# Patient Record
Sex: Female | Born: 1980 | Race: White | Hispanic: No | Marital: Married | Smoking: Never smoker
Health system: Southern US, Community
[De-identification: ages and names within clinical notes are randomized; demographics above are authoritative.]

## PROBLEM LIST (undated history)

## (undated) DIAGNOSIS — Z789 Other specified health status: Secondary | ICD-10-CM

## (undated) DIAGNOSIS — I8390 Asymptomatic varicose veins of unspecified lower extremity: Secondary | ICD-10-CM

## (undated) HISTORY — DX: Asymptomatic varicose veins of unspecified lower extremity: I83.90

## (undated) HISTORY — PX: VENOUS THROMBECTOMY: SHX834

---
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
## 2006-08-11 ENCOUNTER — Ambulatory Visit: Payer: Self-pay | Admitting: Family Medicine

## 2007-10-22 IMAGING — US ABDOMEN ULTRASOUND
1 series · 17 of 25 positions shown · non-contrast
Comparison: none

REASON FOR EXAM: abdominal pain
COMMENTS:

[Series 1: abdomen ultrasound · 17 of 78 slices shown]
[im 1/78]
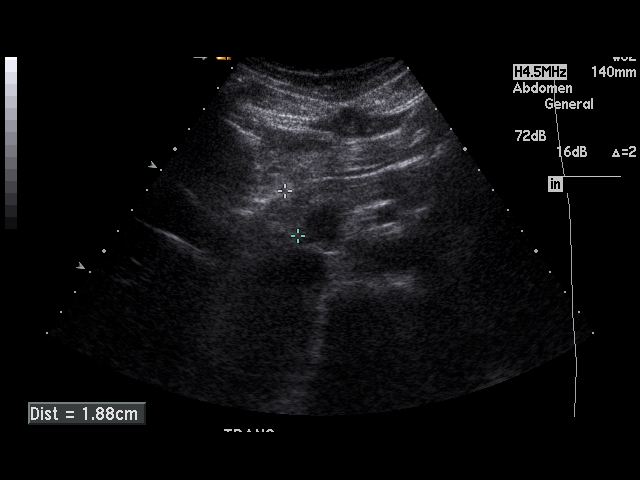
[im 7/78]
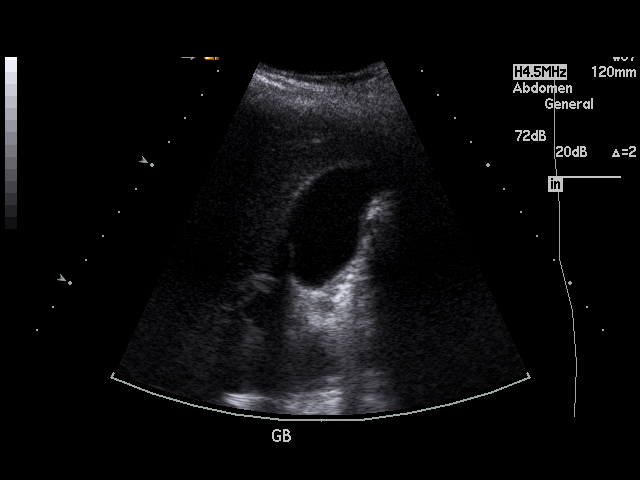
[im 10/78]
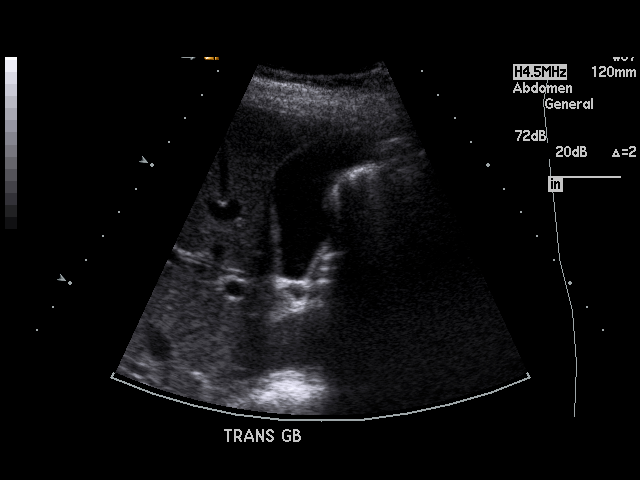
[im 17/78]
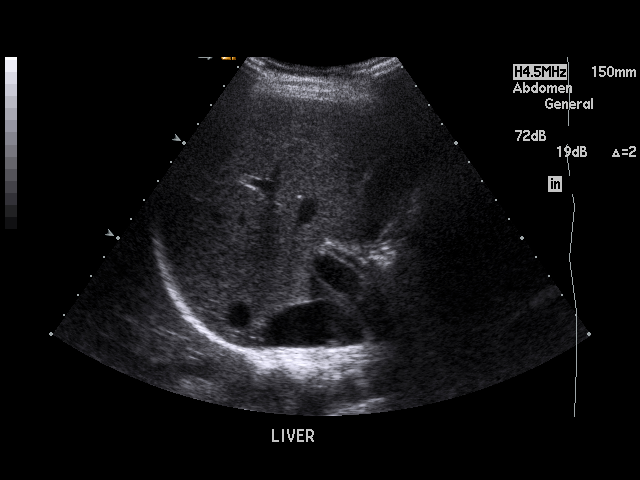
[im 20/78]
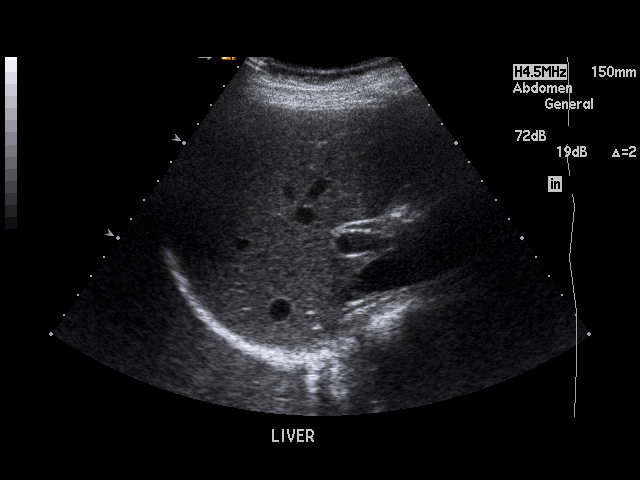
[im 26/78]
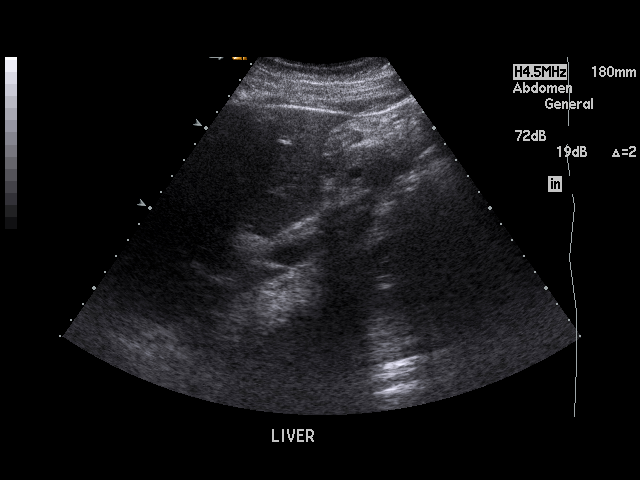
[im 29/78]
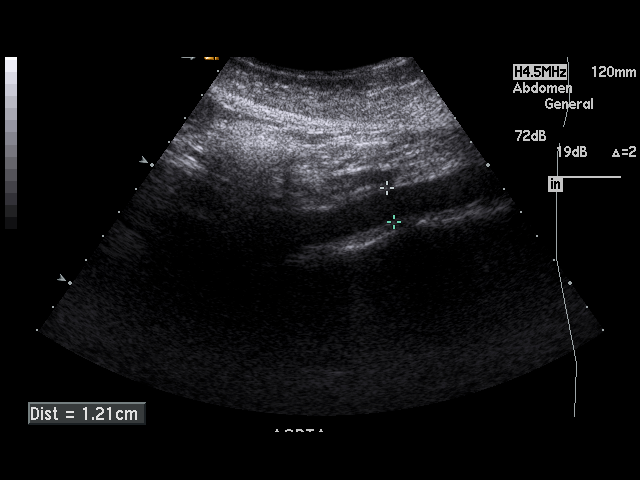
[im 36/78]
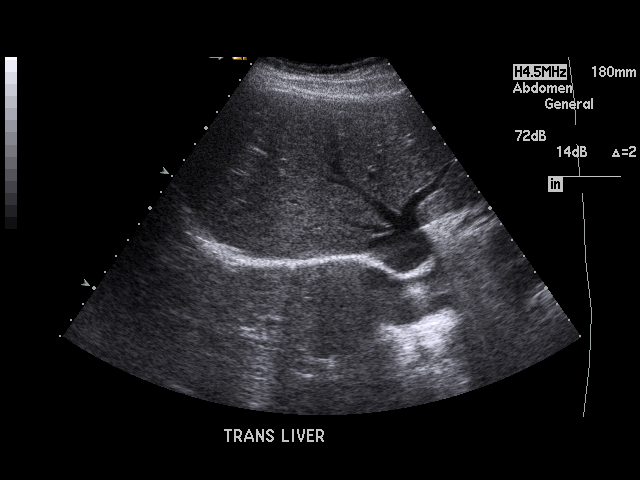
[im 39/78]
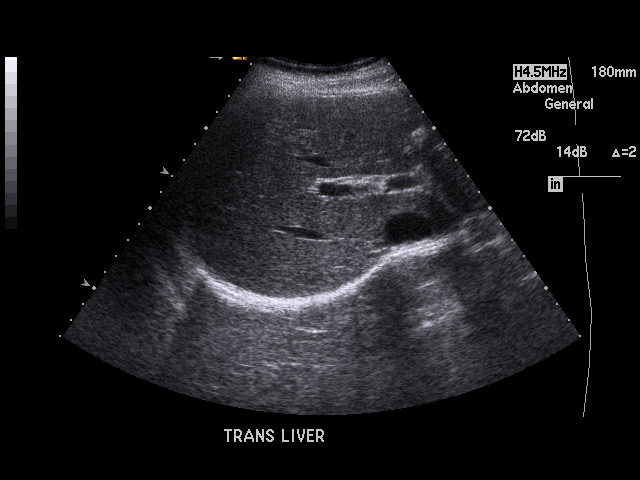
[im 42/78]
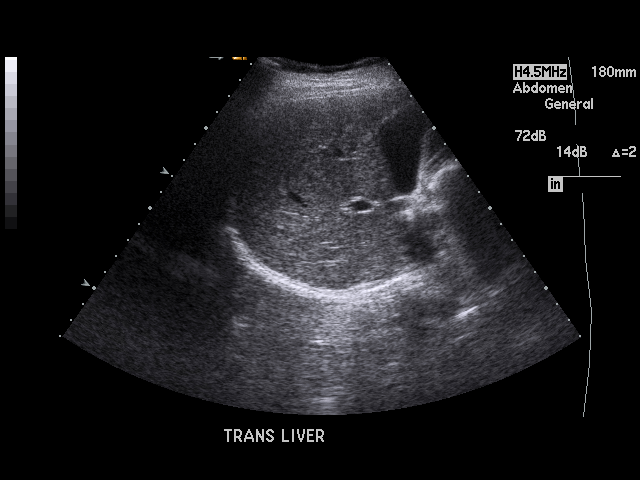
[im 49/78]
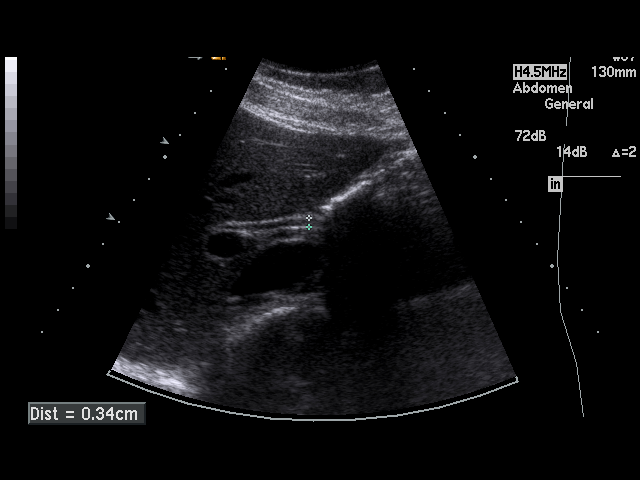
[im 52/78]
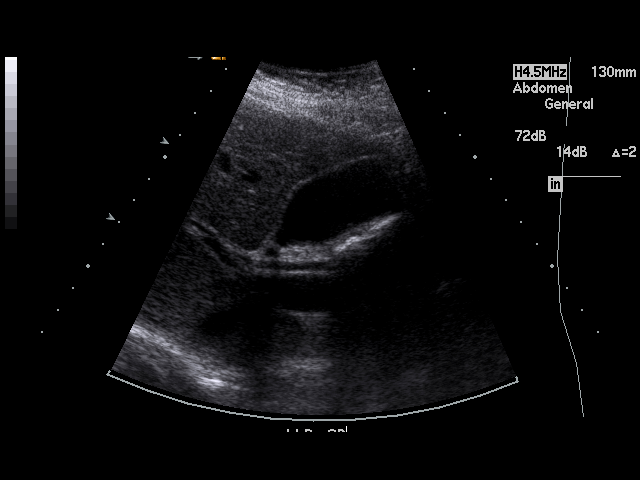
[im 58/78]
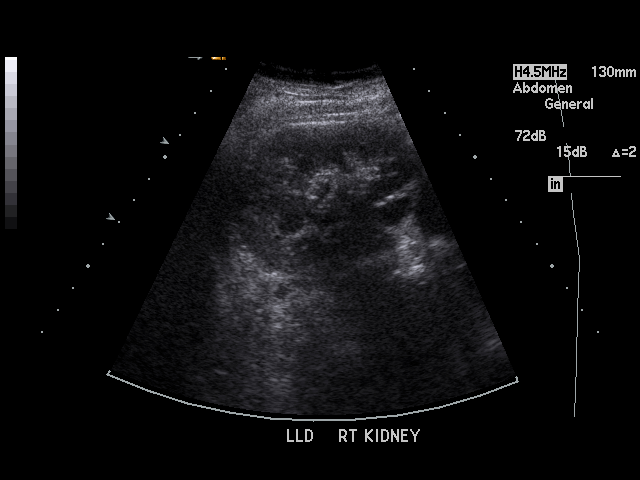
[im 61/78]
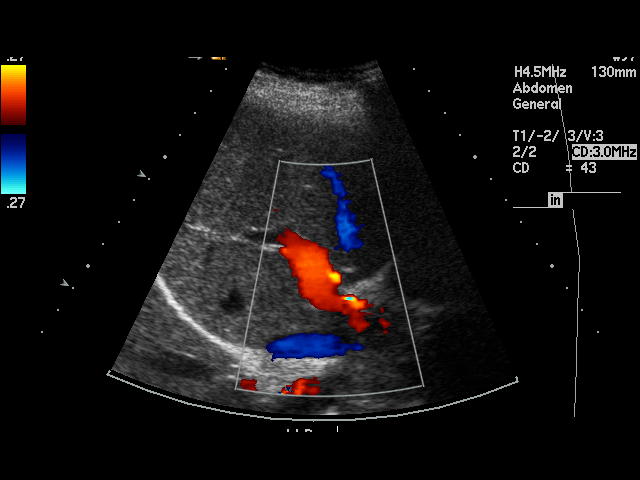
[im 68/78]
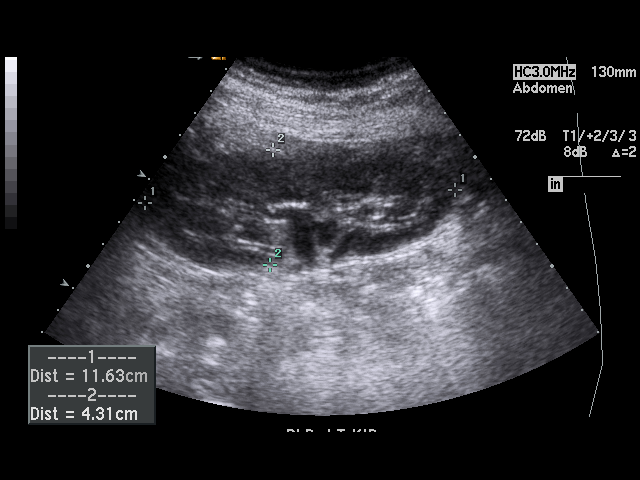
[im 71/78]
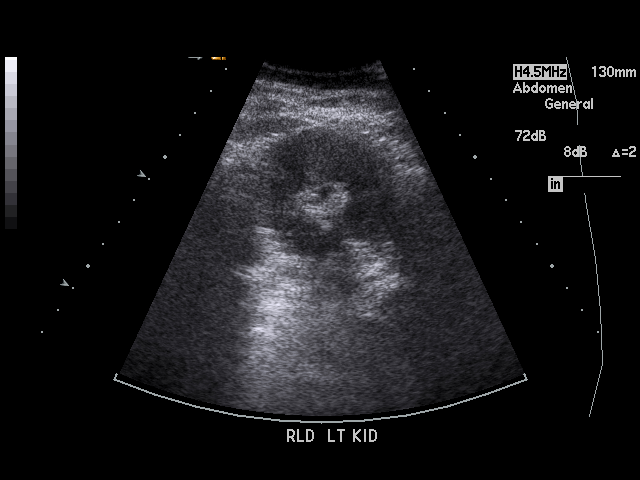
[im 78/78]
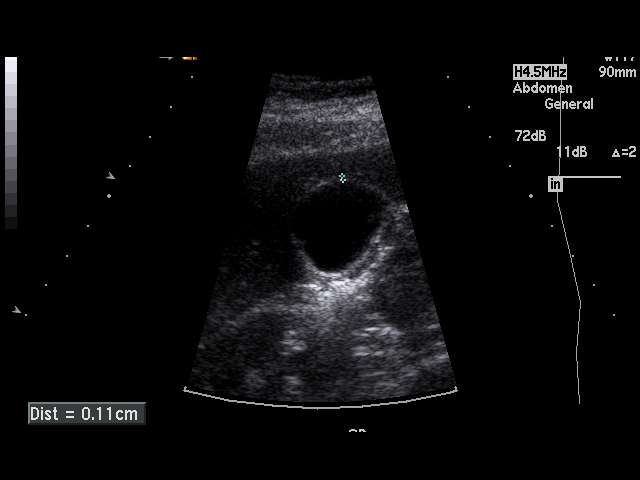

[17 of 25 positions shown; findings below may reference images not displayed]

PROCEDURE:     ULKER - ULKER ABDOMEN GENERAL SURVEY  - August 11, 2006 [DATE]

RESULT:     The liver, spleen, pancreas and abdominal aorta reveal no
significant abnormalities. No gallstones are seen. There is no thickening of
the gallbladder wall. The common bile duct measures 3.7 mm in diameter which
is within normal limits. The kidneys show no hydronephrosis. There is no
ascites.
IMPRESSION: 1.     No significant abnormalities are noted.

## 2015-06-27 ENCOUNTER — Encounter: Payer: Self-pay | Admitting: Obstetrics and Gynecology

## 2015-06-27 ENCOUNTER — Ambulatory Visit (INDEPENDENT_AMBULATORY_CARE_PROVIDER_SITE_OTHER): Payer: 59 | Admitting: Obstetrics and Gynecology

## 2015-06-27 VITALS — BP 102/61 | HR 68 | Ht 68.5 in | Wt 123.1 lb

## 2015-06-27 DIAGNOSIS — Z01419 Encounter for gynecological examination (general) (routine) without abnormal findings: Secondary | ICD-10-CM | POA: Diagnosis not present

## 2015-06-27 DIAGNOSIS — Z809 Family history of malignant neoplasm, unspecified: Secondary | ICD-10-CM

## 2015-06-27 NOTE — Patient Instructions (Signed)
Health Maintenance, Female Adopting a healthy lifestyle and getting preventive care can go a long way to promote health and wellness. Talk with your health care provider about what schedule of regular examinations is right for you. This is a good chance for you to check in with your provider about disease prevention and staying healthy. In between checkups, there are plenty of things you can do on your own. Experts have done a lot of research about which lifestyle changes and preventive measures are most likely to keep you healthy. Ask your health care provider for more information. WEIGHT AND DIET  Eat a healthy diet  Be sure to include plenty of vegetables, fruits, low-fat dairy products, and lean protein.  Do not eat a lot of foods high in solid fats, added sugars, or salt.  Get regular exercise. This is one of the most important things you can do for your health.  Most adults should exercise for at least 150 minutes each week. The exercise should increase your heart rate and make you sweat (moderate-intensity exercise).  Most adults should also do strengthening exercises at least twice a week. This is in addition to the moderate-intensity exercise.  Maintain a healthy weight  Body mass index (BMI) is a measurement that can be used to identify possible weight problems. It estimates body fat based on height and weight. Your health care provider can help determine your BMI and help you achieve or maintain a healthy weight.  For females 20 years of age and older:   A BMI below 18.5 is considered underweight.  A BMI of 18.5 to 24.9 is normal.  A BMI of 25 to 29.9 is considered overweight.  A BMI of 30 and above is considered obese.  Watch levels of cholesterol and blood lipids  You should start having your blood tested for lipids and cholesterol at 33 years of age, then have this test every 5 years.  You may need to have your cholesterol levels checked more often if:  Your lipid  or cholesterol levels are high.  You are older than 33 years of age.  You are at high risk for heart disease.  CANCER SCREENING   Lung Cancer  Lung cancer screening is recommended for adults 55-80 years old who are at high risk for lung cancer because of a history of smoking.  A yearly low-dose CT scan of the lungs is recommended for people who:  Currently smoke.  Have quit within the past 15 years.  Have at least a 30-pack-year history of smoking. A pack year is smoking an average of one pack of cigarettes a day for 1 year.  Yearly screening should continue until it has been 15 years since you quit.  Yearly screening should stop if you develop a health problem that would prevent you from having lung cancer treatment.  Breast Cancer  Practice breast self-awareness. This means understanding how your breasts normally appear and feel.  It also means doing regular breast self-exams. Let your health care provider know about any changes, no matter how small.  If you are in your 20s or 30s, you should have a clinical breast exam (CBE) by a health care provider every 1-3 years as part of a regular health exam.  If you are 40 or older, have a CBE every year. Also consider having a breast X-ray (mammogram) every year.  If you have a family history of breast cancer, talk to your health care provider about genetic screening.  If you   are at high risk for breast cancer, talk to your health care provider about having an MRI and a mammogram every year.  Breast cancer gene (BRCA) assessment is recommended for women who have family members with BRCA-related cancers. BRCA-related cancers include:  Breast.  Ovarian.  Tubal.  Peritoneal cancers.  Results of the assessment will determine the need for genetic counseling and BRCA1 and BRCA2 testing. Cervical Cancer Your health care provider may recommend that you be screened regularly for cancer of the pelvic organs (ovaries, uterus, and  vagina). This screening involves a pelvic examination, including checking for microscopic changes to the surface of your cervix (Pap test). You may be encouraged to have this screening done every 3 years, beginning at age 21.  For women ages 30-65, health care providers may recommend pelvic exams and Pap testing every 3 years, or they may recommend the Pap and pelvic exam, combined with testing for human papilloma virus (HPV), every 5 years. Some types of HPV increase your risk of cervical cancer. Testing for HPV may also be done on women of any age with unclear Pap test results.  Other health care providers may not recommend any screening for nonpregnant women who are considered low risk for pelvic cancer and who do not have symptoms. Ask your health care provider if a screening pelvic exam is right for you.  If you have had past treatment for cervical cancer or a condition that could lead to cancer, you need Pap tests and screening for cancer for at least 20 years after your treatment. If Pap tests have been discontinued, your risk factors (such as having a new sexual partner) need to be reassessed to determine if screening should resume. Some women have medical problems that increase the chance of getting cervical cancer. In these cases, your health care provider may recommend more frequent screening and Pap tests. Colorectal Cancer  This type of cancer can be detected and often prevented.  Routine colorectal cancer screening usually begins at 33 years of age and continues through 33 years of age.  Your health care provider may recommend screening at an earlier age if you have risk factors for colon cancer.  Your health care provider may also recommend using home test kits to check for hidden blood in the stool.  A small camera at the end of a tube can be used to examine your colon directly (sigmoidoscopy or colonoscopy). This is done to check for the earliest forms of colorectal  cancer.  Routine screening usually begins at age 50.  Direct examination of the colon should be repeated every 5-10 years through 33 years of age. However, you may need to be screened more often if early forms of precancerous polyps or small growths are found. Skin Cancer  Check your skin from head to toe regularly.  Tell your health care provider about any new moles or changes in moles, especially if there is a change in a mole's shape or color.  Also tell your health care provider if you have a mole that is larger than the size of a pencil eraser.  Always use sunscreen. Apply sunscreen liberally and repeatedly throughout the day.  Protect yourself by wearing long sleeves, pants, a wide-brimmed hat, and sunglasses whenever you are outside. HEART DISEASE, DIABETES, AND HIGH BLOOD PRESSURE   High blood pressure causes heart disease and increases the risk of stroke. High blood pressure is more likely to develop in:  People who have blood pressure in the high end   of the normal range (130-139/85-89 mm Hg).  People who are overweight or obese.  People who are African American.  If you are 38-23 years of age, have your blood pressure checked every 3-5 years. If you are 61 years of age or older, have your blood pressure checked every year. You should have your blood pressure measured twice--once when you are at a hospital or clinic, and once when you are not at a hospital or clinic. Record the average of the two measurements. To check your blood pressure when you are not at a hospital or clinic, you can use:  An automated blood pressure machine at a pharmacy.  A home blood pressure monitor.  If you are between 45 years and 39 years old, ask your health care provider if you should take aspirin to prevent strokes.  Have regular diabetes screenings. This involves taking a blood sample to check your fasting blood sugar level.  If you are at a normal weight and have a low risk for diabetes,  have this test once every three years after 33 years of age.  If you are overweight and have a high risk for diabetes, consider being tested at a younger age or more often. PREVENTING INFECTION  Hepatitis B  If you have a higher risk for hepatitis B, you should be screened for this virus. You are considered at high risk for hepatitis B if:  You were born in a country where hepatitis B is common. Ask your health care provider which countries are considered high risk.  Your parents were born in a high-risk country, and you have not been immunized against hepatitis B (hepatitis B vaccine).  You have HIV or AIDS.  You use needles to inject street drugs.  You live with someone who has hepatitis B.  You have had sex with someone who has hepatitis B.  You get hemodialysis treatment.  You take certain medicines for conditions, including cancer, organ transplantation, and autoimmune conditions. Hepatitis C  Blood testing is recommended for:  Everyone born from [birth year] through 1963.  Anyone with known risk factors for hepatitis C. Sexually transmitted infections (STIs)  You should be screened for sexually transmitted infections (STIs) including gonorrhea and chlamydia if:  You are sexually active and are younger than 33 years of age.  You are older than 33 years of age and your health care provider tells you that you are at risk for this type of infection.  Your sexual activity has changed since you were last screened and you are at an increased risk for chlamydia or gonorrhea. Ask your health care provider if you are at risk.  If you do not have HIV, but are at risk, it may be recommended that you take a prescription medicine daily to prevent HIV infection. This is called pre-exposure prophylaxis (PrEP). You are considered at risk if:  You are sexually active and do not regularly use condoms or know the HIV status of your partner(s).  You take drugs by injection.  You are sexually  active with a partner who has HIV. Talk with your health care provider about whether you are at high risk of being infected with HIV. If you choose to begin PrEP, you should first be tested for HIV. You should then be tested every 3 months for as long as you are taking PrEP.  PREGNANCY   If you are premenopausal and you may become pregnant, ask your health care provider about preconception counseling.  If you may  become pregnant, take 400 to 800 micrograms (mcg) of folic acid every day.  If you want to prevent pregnancy, talk to your health care provider about birth control (contraception). OSTEOPOROSIS AND MENOPAUSE   Osteoporosis is a disease in which the bones lose minerals and strength with aging. This can result in serious bone fractures. Your risk for osteoporosis can be identified using a bone density scan.  If you are 61 years of age or older, or if you are at risk for osteoporosis and fractures, ask your health care provider if you should be screened.  Ask your health care provider whether you should take a calcium or vitamin D supplement to lower your risk for osteoporosis.  Menopause may have certain physical symptoms and risks.  Hormone replacement therapy may reduce some of these symptoms and risks. Talk to your health care provider about whether hormone replacement therapy is right for you.  HOME CARE INSTRUCTIONS   Schedule regular health, dental, and eye exams.  Stay current with your immunizations.   Do not use any tobacco products including cigarettes, chewing tobacco, or electronic cigarettes.  If you are pregnant, do not drink alcohol.  If you are breastfeeding, limit how much and how often you drink alcohol.  Limit alcohol intake to no more than 1 drink per day for nonpregnant women. One drink equals 12 ounces of beer, 5 ounces of wine, or 1 ounces of hard liquor.  Do not use street drugs.  Do not share needles.  Ask your health care provider for help if  you need support or information about quitting drugs.  Tell your health care provider if you often feel depressed.  Tell your health care provider if you have ever been abused or do not feel safe at home.   This information is not intended to replace advice given to you by your health care provider. Make sure you discuss any questions you have with your health care provider.   Document Released: 07/08/2010 Document Revised: 01/13/2014 Document Reviewed: 11/24/2012 Elsevier Interactive Patient Education [employer].

## 2015-06-27 NOTE — Progress Notes (Addendum)
GYNECOLOGY ANNUAL PHYSICAL EXAM PROGRESS NOTE  Subjective:    Debbie Harding is a 35 y.o. G0P0 female who presents to establish care, and for an annual exam. The patient has no complaints today. The patient is sexually active.  The patient wears seatbelts: yes. The patient participates in regular exercise: yes. Has the patient ever been transfused or tattooed?: no. The patient reports that there is not domestic violence in her life.    Gynecologic History Patient's last menstrual period was 06/07/2015. Menarche age: [age] Contraception: condoms History of STI's: Denies Last Pap: ~ 4 years ago. Results were: normal.  Denies h/o abnormal pap smears.    Obstetric History   G0   P0   T0   P0   A0   TAB0   SAB0   E0   M0   L0        History reviewed. No pertinent past medical history.  Past Surgical History  Procedure Laterality Date  . Venous thrombectomy Left     Family History  Problem Relation Age of Onset  . Breast cancer Maternal Grandmother   . Ovarian cancer Maternal Grandmother     Social History   Social History  . Marital Status: Single    Spouse Name: N/A  . Number of Children: N/A  . Years of Education: N/A   Occupational History  . Not on file.   Social History Main Topics  . Smoking status: Never Smoker   . Smokeless tobacco: Not on file  . Alcohol Use: Yes     Comment: Socially   . Drug Use: No  . Sexual Activity: Yes    Birth Control/ Protection: None   Other Topics Concern  . Not on file   Social History Narrative  . No narrative on file    Outpatient Encounter Prescriptions as of 06/27/2015  Medication Sig Note  . BOOSTRIX 5-2.5-18.5 injection Reported on 06/27/2015 06/27/2015: Received from: External Pharmacy   No facility-administered encounter medications on file as of 06/27/2015.    Allergies  Allergen Reactions  . Penicillins   . Sulfur       Review of Systems Constitutional: negative for chills, fatigue, fevers and  sweats Eyes: negative for irritation, redness and visual disturbance Ears, nose, mouth, throat, and face: negative for hearing loss, nasal congestion, snoring and tinnitus Respiratory: negative for asthma, cough, sputum Cardiovascular: negative for chest pain, dyspnea, exertional chest pressure/discomfort, irregular heart beat, palpitations and syncope Gastrointestinal: negative for abdominal pain, change in bowel habits, nausea and vomiting Genitourinary: negative for abnormal menstrual periods, genital lesions, sexual problems and vaginal discharge, dysuria and urinary incontinence Integument/breast: negative for breast lump, breast tenderness and nipple discharge Hematologic/lymphatic: negative for bleeding and easy bruising Musculoskeletal:negative for back pain and muscle weakness Neurological: negative for dizziness, headaches, vertigo and weakness Endocrine: negative for diabetic symptoms including polydipsia, polyuria and skin dryness Allergic/Immunologic: negative for hay fever and urticaria       Objective:  Blood pressure 102/61, pulse 68, height 5' 8.5" (1.74 m), weight 123 lb 1.6 oz (55.838 kg), last menstrual period 06/07/2015. Body mass index is 18.44 kg/(m^2).  General Appearance:    Alert, cooperative, no distress, appears stated age  Head:    Normocephalic, without obvious abnormality, atraumatic  Eyes:    PERRL, conjunctiva/corneas clear, EOM's intact, both eyes  Ears:    Normal external ear canals, both ears  Nose:   Nares normal, septum midline, mucosa normal, no drainage or sinus tenderness  Throat:   Lips, mucosa, and tongue normal; teeth and gums normal  Neck:   Supple, symmetrical, trachea midline, no adenopathy; thyroid: no enlargement/tenderness/nodules; no carotid bruit or JVD  Back:     Symmetric, no curvature, ROM normal, no CVA tenderness  Lungs:     Clear to auscultation bilaterally, respirations unlabored  Chest Wall:    No tenderness or deformity    Heart:    Regular rate and rhythm, S1 and S2 normal, no murmur, rub or gallop  Breast Exam:    No tenderness, masses, or nipple abnormality. Fibrocystic changes noted (right>left).  Abdomen:     Soft, non-tender, bowel sounds active all four quadrants, no masses, no organomegaly.    Genitalia:    Pelvic:external genitalia normal, vagina without lesions, discharge, or tenderness, rectovaginal septum  normal. Cervix normal in appearance, no cervical motion tenderness, no adnexal masses or tenderness.  Uterus normal size, shape, mobile, regular contours, nontender.  Rectal:    Normal external sphincter.  No hemorrhoids appreciated. Internal exam not done.   Extremities:   Extremities normal, atraumatic, no cyanosis or edema  Pulses:   2+ and symmetric all extremities  Skin:   Skin color, texture, turgor normal, no rashes or lesions  Lymph nodes:   Cervical, supraclavicular, and axillary nodes normal  Neurologic:   CNII-XII intact, normal strength, sensation and reflexes throughout   .  Assessment:   Healthy female exam.  Family history of cancer (breast/ovarian)  Plan:    Blood tests: Basic metabolic panel, CBC with diff and Vitamin D level. Breast self exam technique reviewed and patient encouraged to perform self-exam monthly. Contraception: condoms Discussed healthy lifestyle modifications. Pap smear performed today. Family h/o breast cancer in [place] at age 85, ovarian cancer age 55's. Patient also reports Great Aunt with breast cancer in her 30's. Discussed recommendations for mother to be tested.  Patient notes that her mother does not go to the doctor.  Had a cousin that was tested and was negative.  Also discussed possible referral for genetic counseling and testing.  Patient to inform if referral desired.  Follow up in 1 year.     Hildred Laser, MD [facility]

## 2015-06-28 LAB — COMPREHENSIVE METABOLIC PANEL
A/G RATIO: 1.9 (ref 1.2–2.2)
ALK PHOS: 39 IU/L (ref 39–117)
ALT: 11 IU/L (ref 0–32)
AST: 13 IU/L (ref 0–40)
Albumin: 4.4 g/dL (ref 3.5–5.5)
BILIRUBIN TOTAL: 0.6 mg/dL (ref 0.0–1.2)
BUN/Creatinine Ratio: 13 (ref 9–23)
BUN: 8 mg/dL (ref 6–20)
CO2: 21 mmol/L (ref 18–29)
Calcium: 9.3 mg/dL (ref 8.7–10.2)
Chloride: 102 mmol/L (ref 96–106)
Creatinine, Ser: 0.63 mg/dL (ref 0.57–1.00)
GFR calc Af Amer: 137 mL/min/{1.73_m2} (ref >59)
GFR calc non Af Amer: 119 mL/min/{1.73_m2} (ref >59)
Globulin, Total: 2.3 g/dL (ref 1.5–4.5)
Glucose: 82 mg/dL (ref 65–99)
POTASSIUM: 4.3 mmol/L (ref 3.5–5.2)
Sodium: 140 mmol/L (ref 134–144)
Total Protein: 6.7 g/dL (ref 6.0–8.5)

## 2015-06-28 LAB — CBC WITH DIFFERENTIAL/PLATELET
BASOS: 1 %
Basophils Absolute: 0 10*3/uL (ref 0.0–0.2)
EOS (ABSOLUTE): 0.2 10*3/uL (ref 0.0–0.4)
Eos: 4 %
Hematocrit: 42 % (ref 34.0–46.6)
Hemoglobin: 13.6 g/dL (ref 11.1–15.9)
IMMATURE GRANULOCYTES: 0 %
Immature Grans (Abs): 0 10*3/uL (ref 0.0–0.1)
Lymphocytes Absolute: 1.4 10*3/uL (ref 0.7–3.1)
Lymphs: 26 %
MCH: 30.5 pg (ref 26.6–33.0)
MCHC: 32.4 g/dL (ref 31.5–35.7)
MCV: 94 fL (ref 79–97)
MONOS ABS: 0.4 10*3/uL (ref 0.1–0.9)
Monocytes: 7 %
NEUTROS PCT: 62 %
Neutrophils Absolute: 3.3 10*3/uL (ref 1.4–7.0)
PLATELETS: 235 10*3/uL (ref 150–379)
RBC: 4.46 x10E6/uL (ref 3.77–5.28)
RDW: 13.8 % (ref 12.3–15.4)
WBC: 5.3 10*3/uL (ref 3.4–10.8)

## 2015-06-28 LAB — VITAMIN D 25 HYDROXY (VIT D DEFICIENCY, FRACTURES): Vit D, 25-Hydroxy: 16.2 ng/mL — ABNORMAL LOW (ref 30.0–100.0)

## 2015-06-30 LAB — PAP IG AND HPV HIGH-RISK
HPV, high-risk: NEGATIVE
PAP Smear Comment: 0

## 2015-07-02 DIAGNOSIS — Z809 Family history of malignant neoplasm, unspecified: Secondary | ICD-10-CM | POA: Insufficient documentation

## 2015-07-04 ENCOUNTER — Telehealth: Payer: Self-pay

## 2015-07-04 NOTE — Telephone Encounter (Signed)
Called pt informed her of the information below.  

## 2015-07-04 NOTE — Telephone Encounter (Signed)
-----   Message from Hildred LaserAnika Cherry, MD sent at 07/03/2015  7:09 PM EDT ----- Please inform of normal pap and annual labs except Vit D deficient.  Needs to take vitamin D supplementation OTC, 1000 mIU daily.

## 2016-01-07 HISTORY — PX: SKIN TAG REMOVAL: SHX780

## 2016-07-01 ENCOUNTER — Ambulatory Visit (INDEPENDENT_AMBULATORY_CARE_PROVIDER_SITE_OTHER): Payer: 59 | Admitting: Obstetrics and Gynecology

## 2016-07-01 ENCOUNTER — Encounter: Payer: Self-pay | Admitting: Obstetrics and Gynecology

## 2016-07-01 VITALS — BP 115/74 | HR 84 | Ht 68.5 in | Wt 122.0 lb

## 2016-07-01 DIAGNOSIS — Z13220 Encounter for screening for lipoid disorders: Secondary | ICD-10-CM

## 2016-07-01 DIAGNOSIS — Z8639 Personal history of other endocrine, nutritional and metabolic disease: Secondary | ICD-10-CM | POA: Diagnosis not present

## 2016-07-01 DIAGNOSIS — Z01419 Encounter for gynecological examination (general) (routine) without abnormal findings: Secondary | ICD-10-CM

## 2016-07-01 DIAGNOSIS — Z808 Family history of malignant neoplasm of other organs or systems: Secondary | ICD-10-CM | POA: Diagnosis not present

## 2016-07-01 DIAGNOSIS — Z809 Family history of malignant neoplasm, unspecified: Secondary | ICD-10-CM

## 2016-07-01 NOTE — Patient Instructions (Addendum)

## 2016-07-01 NOTE — Progress Notes (Signed)
GYNECOLOGY ANNUAL PHYSICAL EXAM PROGRESS NOTE  Subjective:    Debbie Harding is a 36 y.o. G0P0 female who presents for an annual exam. The patient has no complaints today. The patient is sexually active.  The patient wears seatbelts: yes. The patient participates in regular exercise: yes. Has the patient ever been transfused or tattooed?: no. The patient reports that there is not domestic violence in her life.    Gynecologic History Patient's last menstrual period was 06/01/2016. Menarche age: [age] Contraception: rhythm method History of STI's: Denies Last Pap: 06/2015. Results were: normal.  Denies h/o abnormal pap smears.    Obstetric History   G0   P0   T0   P0   A0   L0    SAB0   TAB0   Ectopic0   Multiple0   Live Births0        History reviewed. No pertinent past medical history.  Past Surgical History:  Procedure Laterality Date  . SKIN TAG REMOVAL  2018  . VENOUS THROMBECTOMY Left     Family History  Problem Relation Age of Onset  . Breast cancer Maternal Grandmother   . Ovarian cancer Maternal Grandmother     Social History   Social History  . Marital status: Single    Spouse name: N/A  . Number of children: N/A  . Years of education: N/A   Occupational History  . Not on file.   Social History Main Topics  . Smoking status: Never Smoker  . Smokeless tobacco: Never Used  . Alcohol use Yes     Comment: Socially   . Drug use: No  . Sexual activity: Yes    Birth control/ protection: None, Coitus interruptus   Other Topics Concern  . Not on file   Social History Narrative  . No narrative on file    Outpatient Encounter Prescriptions as of 07/01/2016  Medication Sig Note  . BOOSTRIX 5-2.5-18.5 injection Reported on 06/27/2015 06/27/2015: Received from: External Pharmacy  . cetirizine (ZYRTEC) 10 MG tablet Take 10 mg by mouth.    No facility-administered encounter medications on file as of 07/01/2016.     Allergies  Allergen Reactions  . Latex  Rash    Irritates skin severly  . Penicillins   . Sulfur     Review of Systems Constitutional: negative for chills, fatigue, fevers and sweats Eyes: negative for irritation, redness and visual disturbance Ears, nose, mouth, throat, and face: negative for hearing loss, nasal congestion, snoring and tinnitus Respiratory: negative for asthma, cough, sputum Cardiovascular: negative for chest pain, dyspnea, exertional chest pressure/discomfort, irregular heart beat, palpitations and syncope Gastrointestinal: negative for abdominal pain, change in bowel habits, nausea and vomiting Genitourinary: negative for abnormal menstrual periods, genital lesions, sexual problems and vaginal discharge, dysuria and urinary incontinence Integument/breast: negative for breast lump, breast tenderness and nipple discharge Hematologic/lymphatic: negative for bleeding and easy bruising Musculoskeletal:negative for back pain and muscle weakness Neurological: negative for dizziness, headaches, vertigo and weakness Endocrine: negative for diabetic symptoms including polydipsia, polyuria and skin dryness Allergic/Immunologic: negative for hay fever and urticaria      Objective:  Blood pressure 115/74, pulse 84, height 5' 8.5" (1.74 m), weight 122 lb (55.3 kg), last menstrual period 06/01/2016. Body mass index is 18.28 kg/m.  General Appearance:    Alert, cooperative, no distress, appears stated age  Head:    Normocephalic, without obvious abnormality, atraumatic  Eyes:    PERRL, conjunctiva/corneas clear, EOM's intact, both eyes  Ears:  Normal external ear canals, both ears  Nose:   Nares normal, septum midline, mucosa normal, no drainage or sinus tenderness  Throat:   Lips, mucosa, and tongue normal; teeth and gums normal  Neck:   Supple, symmetrical, trachea midline, no adenopathy; thyroid: no enlargement/tenderness/nodules; no carotid bruit or JVD  Back:     Symmetric, no curvature, ROM normal, no CVA  tenderness  Lungs:     Clear to auscultation bilaterally, respirations unlabored  Chest Wall:    No tenderness or deformity   Heart:    Regular rate and rhythm, S1 and S2 normal, no murmur, rub or gallop  Breast Exam:   Breasts: normal appearance, no masses or tenderness, No nipple discharge or bleeding, No axillary or supraclavicular adenopathy   Abdomen:     Soft, non-tender, bowel sounds active all four quadrants, no masses, no organomegaly.    Genitalia:    Pelvic:external genitalia normal, vagina without lesions, discharge, or tenderness, rectovaginal septum  normal. Cervix normal in appearance, no cervical motion tenderness, no adnexal masses or tenderness.  Uterus normal size, shape, mobile, regular contours, nontender.  Rectal:    Normal external sphincter.  No hemorrhoids appreciated. Internal exam not done.   Extremities:   Extremities normal, atraumatic, no cyanosis or edema  Pulses:   2+ and symmetric all extremities  Skin:   Skin color, texture, turgor normal, no rashes or lesions  Lymph nodes:   Cervical, supraclavicular, and axillary nodes normal  Neurologic:   CNII-XII intact, normal strength, sensation and reflexes throughout   .  Assessment:   Healthy female exam.  Family history of cancer (breast/ovarian)  Plan:    Blood tests: Basic metabolic panel, CBC with diff and Vitamin D level. Patient also requests for cholesterol screening.   Breast self exam technique reviewed and patient encouraged to perform self-exam monthly. Contraception: rhythm method Discussed healthy lifestyle modifications. Pap smear up to date. Family h/o breast and ovarian cancer.  Had discussed recommendations for mother or patient to be tested last year during annual exam.  Patient notes that her mother does not go to the doctor. Debbie Harding states that she is strongly considering performing the testing.  To return to have lab completed once decision made.  Follow up in 1 year.     Hildred Laser,  MD [facility]

## 2016-07-02 LAB — CBC WITH DIFFERENTIAL/PLATELET
BASOS ABS: 0 10*3/uL (ref 0.0–0.2)
Basos: 1 %
EOS (ABSOLUTE): 0.3 10*3/uL (ref 0.0–0.4)
Eos: 5 %
Hematocrit: 41.2 % (ref 34.0–46.6)
Hemoglobin: 14 g/dL (ref 11.1–15.9)
Immature Grans (Abs): 0 10*3/uL (ref 0.0–0.1)
Immature Granulocytes: 0 %
Lymphocytes Absolute: 1.6 10*3/uL (ref 0.7–3.1)
Lymphs: 29 %
MCH: 30.9 pg (ref 26.6–33.0)
MCHC: 34 g/dL (ref 31.5–35.7)
MCV: 91 fL (ref 79–97)
MONOS ABS: 0.3 10*3/uL (ref 0.1–0.9)
Monocytes: 5 %
NEUTROS PCT: 60 %
Neutrophils Absolute: 3.4 10*3/uL (ref 1.4–7.0)
PLATELETS: 228 10*3/uL (ref 150–379)
RBC: 4.53 x10E6/uL (ref 3.77–5.28)
RDW: 13.9 % (ref 12.3–15.4)
WBC: 5.6 10*3/uL (ref 3.4–10.8)

## 2016-07-02 LAB — LIPID PANEL
Chol/HDL Ratio: 2.5 ratio (ref 0.0–4.4)
Cholesterol, Total: 177 mg/dL (ref 100–199)
HDL: 72 mg/dL (ref >39)
LDL CALC: 93 mg/dL (ref 0–99)
Triglycerides: 61 mg/dL (ref 0–149)
VLDL CHOLESTEROL CAL: 12 mg/dL (ref 5–40)

## 2016-07-02 LAB — COMPREHENSIVE METABOLIC PANEL
ALBUMIN: 4.2 g/dL (ref 3.5–5.5)
ALK PHOS: 40 IU/L (ref 39–117)
ALT: 13 IU/L (ref 0–32)
AST: 15 IU/L (ref 0–40)
Albumin/Globulin Ratio: 1.8 (ref 1.2–2.2)
BILIRUBIN TOTAL: 0.5 mg/dL (ref 0.0–1.2)
BUN / CREAT RATIO: 13 (ref 9–23)
BUN: 9 mg/dL (ref 6–20)
CHLORIDE: 105 mmol/L (ref 96–106)
CO2: 23 mmol/L (ref 20–29)
Calcium: 9.5 mg/dL (ref 8.7–10.2)
Creatinine, Ser: 0.67 mg/dL (ref 0.57–1.00)
GFR calc Af Amer: 134 mL/min/{1.73_m2} (ref >59)
GFR calc non Af Amer: 116 mL/min/{1.73_m2} (ref >59)
GLOBULIN, TOTAL: 2.4 g/dL (ref 1.5–4.5)
GLUCOSE: 81 mg/dL (ref 65–99)
Potassium: 4.5 mmol/L (ref 3.5–5.2)
SODIUM: 142 mmol/L (ref 134–144)
Total Protein: 6.6 g/dL (ref 6.0–8.5)

## 2016-07-02 LAB — VITAMIN D 25 HYDROXY (VIT D DEFICIENCY, FRACTURES): Vit D, 25-Hydroxy: 17 ng/mL — ABNORMAL LOW (ref 30.0–100.0)

## 2016-07-03 ENCOUNTER — Other Ambulatory Visit: Payer: Self-pay | Admitting: Obstetrics and Gynecology

## 2016-07-03 MED ORDER — VITAMIN D (ERGOCALCIFEROL) 1.25 MG (50000 UNIT) PO CAPS: 50000.0000 [IU] | ORAL_CAPSULE | ORAL | 0 refills | Status: DC

## 2017-04-10 ENCOUNTER — Ambulatory Visit (INDEPENDENT_AMBULATORY_CARE_PROVIDER_SITE_OTHER): Payer: 59 | Admitting: Obstetrics and Gynecology

## 2017-04-10 VITALS — BP 92/56 | HR 79 | Ht 68.5 in | Wt 125.5 lb

## 2017-04-10 DIAGNOSIS — A64 Unspecified sexually transmitted disease: Secondary | ICD-10-CM

## 2017-04-10 DIAGNOSIS — O09511 Supervision of elderly primigravida, first trimester: Secondary | ICD-10-CM

## 2017-04-10 DIAGNOSIS — Z1389 Encounter for screening for other disorder: Secondary | ICD-10-CM

## 2017-04-10 DIAGNOSIS — O09819 Supervision of pregnancy resulting from assisted reproductive technology, unspecified trimester: Secondary | ICD-10-CM

## 2017-04-10 DIAGNOSIS — Z3A09 9 weeks gestation of pregnancy: Secondary | ICD-10-CM

## 2017-04-10 NOTE — Progress Notes (Signed)
      Debbie ShropshireSarah E Harding presents for NOB nurse intake visit. [facility] by Dr. Lenice LlamasJeffery Deaton on 03/23/17.  G2.  P0.  LMP  02/04/17.  EDD 11/13/17.  GA 1370w2d. Pt had miscarriage in Dec 2018. Used fertility assistance to help with last and current pregnancy. Pregnancy education material explained and given.  0 cats in the home.  NOB labs ordered.  HIV and drug screen explained and ordered. Genetic screening discussed. Genetic testing; Unknown.. Pt to discuss genetic testing with provider. PNV encouraged. Pt to follow up with provider in 2 weeks for NOB physical.     BP (!) 92/56   Pulse 79   Ht 5' 8.5" (1.74 m)   Wt 125 lb 8 oz (56.9 kg)   LMP 02/04/2017   BMI 18.80 kg/m

## 2017-04-11 LAB — CBC WITH DIFFERENTIAL/PLATELET
BASOS ABS: 0 10*3/uL (ref 0.0–0.2)
Basos: 0 %
EOS (ABSOLUTE): 0.2 10*3/uL (ref 0.0–0.4)
EOS: 2 %
HEMATOCRIT: 40 % (ref 34.0–46.6)
Hemoglobin: 13.4 g/dL (ref 11.1–15.9)
Immature Grans (Abs): 0 10*3/uL (ref 0.0–0.1)
Immature Granulocytes: 0 %
LYMPHS ABS: 1.4 10*3/uL (ref 0.7–3.1)
Lymphs: 18 %
MCH: 31.8 pg (ref 26.6–33.0)
MCHC: 33.5 g/dL (ref 31.5–35.7)
MCV: 95 fL (ref 79–97)
Monocytes Absolute: 0.4 10*3/uL (ref 0.1–0.9)
Monocytes: 6 %
Neutrophils Absolute: 5.5 10*3/uL (ref 1.4–7.0)
Neutrophils: 74 %
Platelets: 227 10*3/uL (ref 150–379)
RBC: 4.21 x10E6/uL (ref 3.77–5.28)
RDW: 13.1 % (ref 12.3–15.4)
WBC: 7.5 10*3/uL (ref 3.4–10.8)

## 2017-04-11 LAB — ABO AND RH: RH TYPE: POSITIVE

## 2017-04-11 LAB — URINALYSIS, ROUTINE W REFLEX MICROSCOPIC
Bilirubin, UA: NEGATIVE
GLUCOSE, UA: NEGATIVE
Ketones, UA: NEGATIVE
Leukocytes, UA: NEGATIVE
Nitrite, UA: NEGATIVE
PROTEIN UA: NEGATIVE
RBC, UA: NEGATIVE
SPEC GRAV UA: 1.006 (ref 1.005–1.030)
UUROB: 0.2 mg/dL (ref 0.2–1.0)
pH, UA: 7.5 (ref 5.0–7.5)

## 2017-04-11 LAB — HEPATITIS B SURFACE ANTIGEN: Hepatitis B Surface Ag: NEGATIVE

## 2017-04-11 LAB — ANTIBODY SCREEN: ANTIBODY SCREEN: NEGATIVE

## 2017-04-11 LAB — HIV ANTIBODY (ROUTINE TESTING W REFLEX): HIV SCREEN 4TH GENERATION: NONREACTIVE

## 2017-04-11 LAB — DRUG PROFILE, UR, 9 DRUGS (LABCORP)
Amphetamines, Urine: NEGATIVE ng/mL
BARBITURATE QUANT UR: NEGATIVE ng/mL
Benzodiazepine Quant, Ur: NEGATIVE ng/mL
Cannabinoid Quant, Ur: NEGATIVE ng/mL
Cocaine (Metab.): NEGATIVE ng/mL
METHADONE SCREEN, URINE: NEGATIVE ng/mL
OPIATE QUANT UR: NEGATIVE ng/mL
PCP Quant, Ur: NEGATIVE ng/mL
Propoxyphene: NEGATIVE ng/mL

## 2017-04-11 LAB — NICOTINE SCREEN, URINE: Cotinine Ql Scrn, Ur: NEGATIVE ng/mL

## 2017-04-11 LAB — RPR: RPR: NONREACTIVE

## 2017-04-11 LAB — VARICELLA ZOSTER ANTIBODY, IGG: Varicella zoster IgG: 1532 index (ref >165)

## 2017-04-11 LAB — RUBELLA SCREEN: RUBELLA: 2.16 {index} (ref >0.99)

## 2017-04-12 LAB — URINE CULTURE, OB REFLEX

## 2017-04-12 LAB — CULTURE, OB URINE

## 2017-04-14 LAB — GC/CHLAMYDIA PROBE AMP
CHLAMYDIA, DNA PROBE: NEGATIVE
NEISSERIA GONORRHOEAE BY PCR: NEGATIVE

## 2017-04-20 ENCOUNTER — Ambulatory Visit (INDEPENDENT_AMBULATORY_CARE_PROVIDER_SITE_OTHER): Payer: 59 | Admitting: Obstetrics and Gynecology

## 2017-04-20 ENCOUNTER — Encounter: Payer: Self-pay | Admitting: Obstetrics and Gynecology

## 2017-04-20 VITALS — BP 100/62 | HR 78 | Wt 128.0 lb

## 2017-04-20 DIAGNOSIS — K219 Gastro-esophageal reflux disease without esophagitis: Secondary | ICD-10-CM

## 2017-04-20 DIAGNOSIS — Z3481 Encounter for supervision of other normal pregnancy, first trimester: Secondary | ICD-10-CM

## 2017-04-20 DIAGNOSIS — O09529 Supervision of elderly multigravida, unspecified trimester: Secondary | ICD-10-CM | POA: Insufficient documentation

## 2017-04-20 DIAGNOSIS — O09819 Supervision of pregnancy resulting from assisted reproductive technology, unspecified trimester: Secondary | ICD-10-CM

## 2017-04-20 DIAGNOSIS — Z809 Family history of malignant neoplasm, unspecified: Secondary | ICD-10-CM

## 2017-04-20 DIAGNOSIS — O09511 Supervision of elderly primigravida, first trimester: Secondary | ICD-10-CM

## 2017-04-20 DIAGNOSIS — O99611 Diseases of the digestive system complicating pregnancy, first trimester: Secondary | ICD-10-CM

## 2017-04-20 LAB — POCT URINALYSIS DIPSTICK
Bilirubin, UA: NEGATIVE
Glucose, UA: NEGATIVE
Ketones, UA: NEGATIVE
LEUKOCYTES UA: NEGATIVE
NITRITE UA: NEGATIVE
PROTEIN UA: NEGATIVE
RBC UA: NEGATIVE
SPEC GRAV UA: 1.02 (ref 1.010–1.025)
Urobilinogen, UA: 0.2 E.U./dL
pH, UA: 6.5 (ref 5.0–8.0)

## 2017-04-20 MED ORDER — ASPIRIN EC 81 MG PO TBEC: 81.0000 mg | DELAYED_RELEASE_TABLET | Freq: Every day | ORAL | 2 refills | Status: DC

## 2017-04-20 NOTE — Progress Notes (Signed)
OBSTETRIC INITIAL PRENATAL VISIT  Subjective:    Debbie Harding is being seen today for her first obstetrical visit.  This is a planned pregnancy. Pregnancy was conceived with fertility assistance (ovulation induction and IUI with donor sperm). She is a G0P0000 female at [redacted]w[redacted]d gestation, Estimated Date of Delivery: 11/11/17 with last menstrual period of 02/04/2017, consistent with 5 week sono. 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 Relationship with FOB: spouse, living together (age 7). Patient does intend to breast feed. Pregnancy history fully reviewed.    OB History  Gravida Para Term Preterm AB Living  2 1 0 0 0 0  SAB TAB Ectopic Multiple Live Births  0 0 0 0 0    # Outcome Date GA Lbr Len/2nd Weight Sex Delivery Anes PTL Lv  2 Current           1 Para 2018            Gynecologic History:  Last pap smear was 06/2015.  Results were normal.  Denies h/o abnormal pap smears in the past.  Denies history of STIs.    History reviewed. No pertinent past medical history.   Family History  Problem Relation Age of Onset  . Breast cancer Maternal Grandmother 80  . Ovarian cancer Maternal Grandmother 42  . Breast cancer Other 35    Past Surgical History:  Procedure Laterality Date  . SKIN TAG REMOVAL  2018  . VENOUS THROMBECTOMY Left     Social History   Socioeconomic History  . Marital status: Single    Spouse name: Not on file  . Number of children: Not on file  . Years of education: Not on file  . Highest education level: Not on file  Occupational History  . Not on file  Social Needs  . Financial resource strain: Not on file  . Food insecurity:    Worry: Not on file    Inability: Not on file  . Transportation needs:    Medical: Not on file    Non-medical: Not on file  Tobacco Use  . Smoking status: Never Smoker  . Smokeless tobacco: Never Used  Substance and Sexual Activity  . Alcohol use: Not Currently    Comment: Socially   . Drug use:  No  . Sexual activity: Yes    Birth control/protection: None, Coitus interruptus  Lifestyle  . Physical activity:    Days per week: Not on file    Minutes per session: Not on file  . Stress: Not on file  Relationships  . Social connections:    Talks on phone: Not on file    Gets together: Not on file    Attends religious service: Not on file    Active member of club or organization: Not on file    Attends meetings of clubs or organizations: Not on file    Relationship status: Not on file  . Intimate partner violence:    Fear of current or ex partner: Not on file    Emotionally abused: Not on file    Physically abused: Not on file    Forced sexual activity: Not on file  Other Topics Concern  . Not on file  Social History Narrative  . Not on file     Current Outpatient Medications on File Prior to Visit  Medication Sig Dispense Refill  . cetirizine (ZYRTEC) 10 MG tablet Take 10 mg by mouth.    . Prenatal Vit-Fe Fumarate-FA (MULTIVITAMIN-PRENATAL) 27-0.8 MG TABS tablet  Take 1 tablet by mouth daily at 12 noon.    Leda Min 5-2.5-18.5 injection Reported on 06/27/2015  0  . Vitamin D, Ergocalciferol, (DRISDOL) 50000 units CAPS capsule Take 1 capsule (50,000 Units total) by mouth every 7 (seven) days. (Patient not taking: Reported on 04/10/2017) 15 capsule 0   No current facility-administered medications on file prior to visit.      Allergies  Allergen Reactions  . Latex Rash    Irritates skin severly  . Penicillins   . Sulfur      Review of Systems General:Not Present- Fever, Weight Loss and Weight Gain. Skin:Not Present- Rash. HEENT:Not Present- Blurred Vision, Headache and Bleeding Gums. Respiratory:Not Present- Difficulty Breathing. Breast:Not Present- Breast Mass. Cardiovascular:Not Present- Chest Pain, Elevated Blood Pressure, Fainting / Blacking Out and Shortness of Breath. Gastrointestinal:Not Present- Abdominal Pain, Constipation, Nausea and Vomiting.  Present - acid reflux Female Genitourinary:Not Present- Frequency, Painful Urination, Pelvic Pain, Vaginal Bleeding, Vaginal Discharge, Contractions, regular, Fetal Movements Decreased, Urinary Complaints and Vaginal Fluid. Musculoskeletal:Not Present- Back Pain and Leg Cramps. Neurological:Not Present- Dizziness. Psychiatric:Not Present- Depression.     Objective:   Blood pressure 100/62, pulse 78, weight 128 lb (58.1 kg), last menstrual period 02/04/2017.  Body mass index is 19.18 kg/m.  General Appearance:    Alert, cooperative, no distress, appears stated age  Head:    Normocephalic, without obvious abnormality, atraumatic  Eyes:    PERRL, conjunctiva/corneas clear, EOM's intact, both eyes  Ears:    Normal external ear canals, both ears  Nose:   Nares normal, septum midline, mucosa normal, no drainage or sinus tenderness  Throat:   Lips, mucosa, and tongue normal; teeth and gums normal  Neck:   Supple, symmetrical, trachea midline, no adenopathy; thyroid: no enlargement/tenderness/nodules; no carotid bruit or JVD  Back:     Symmetric, no curvature, ROM normal, no CVA tenderness  Lungs:     Clear to auscultation bilaterally, respirations unlabored  Chest Wall:    No tenderness or deformity   Heart:    Regular rate and rhythm, S1 and S2 normal, no murmur, rub or gallop  Breast Exam:    No tenderness, masses, or nipple abnormality  Abdomen:     Soft, non-tender, bowel sounds active all four quadrants, no masses, no organomegaly.  FHT 164 bpm.  Genitalia:    Pelvic:external genitalia normal, vagina without lesions, discharge, or tenderness, rectovaginal septum  normal. Cervix normal in appearance, no cervical motion tenderness, no adnexal masses or tenderness.  Pregnancy positive findings: uterine enlargement: 10 wk size, nontender.   Rectal:    Normal external sphincter.  No hemorrhoids appreciated. Internal exam not done.   Extremities:   Extremities normal, atraumatic, no cyanosis  or edema  Pulses:   2+ and symmetric all extremities  Skin:   Skin color, texture, turgor normal, no rashes or lesions  Lymph nodes:   Cervical, supraclavicular, and axillary nodes normal  Neurologic:   CNII-XII intact, normal strength, sensation and reflexes throughout    Assessment:    Pregnancy at 10 and 5/7 weeks   Reproductive assisted pregnancy  Advanced maternal age Family history of cancer Reflux in pregnancy  Plan:    Initial labs reviewed. Pap smear up to date Prenatal vitamins encouraged. Has additional folic acid in prenatals.  Problem list reviewed and updated. New OB counseling:  The patient has been given an overview regarding routine prenatal care.  Recommendations regarding diet, weight gain, and exercise in pregnancy were given. She has completed her  course of progesterone to sustain the pregnancy (finished last week).  Prenatal testing, optional genetic testing, and ultrasound use in pregnancy were reviewed. Cell-free DNA vs 1st or 2nd trimester screening discussed: requested cell-free DNA testing. Panorama ordered. As patient may be AMA by the end of her pregnancy.  Benefits of Breast Feeding were discussed. The patient is encouraged to consider nursing her baby post partum. Family history of breast and ovarian cancer.  I have discussed genetic hereditary cancer screening in the past with patient.  Patient with occasional reflux in pregnancy, takes Tums occasionally which helps some.  Does not desire any prescription medications at this time.  Discussed risk of PIH with h/o fertility assistance and approaching AMA status. Would recommend beginning a daily baby aspirin.  Follow up in 4 weeks.  50% of 30 min visit spent on counseling and coordination of care.

## 2017-04-20 NOTE — Progress Notes (Signed)
ROB-pt is having acid reflux someday lasting for hours. No other concerns.

## 2017-04-21 ENCOUNTER — Encounter: Payer: Self-pay | Admitting: Obstetrics and Gynecology

## 2017-05-19 ENCOUNTER — Encounter: Payer: Self-pay | Admitting: Obstetrics and Gynecology

## 2017-05-19 ENCOUNTER — Ambulatory Visit (INDEPENDENT_AMBULATORY_CARE_PROVIDER_SITE_OTHER): Payer: 59 | Admitting: Obstetrics and Gynecology

## 2017-05-19 VITALS — BP 107/69 | HR 85 | Wt 128.4 lb

## 2017-05-19 DIAGNOSIS — O09819 Supervision of pregnancy resulting from assisted reproductive technology, unspecified trimester: Secondary | ICD-10-CM

## 2017-05-19 DIAGNOSIS — Z3402 Encounter for supervision of normal first pregnancy, second trimester: Secondary | ICD-10-CM

## 2017-05-19 LAB — POCT URINALYSIS DIPSTICK
Bilirubin, UA: NEGATIVE
Glucose, UA: NEGATIVE
Ketones, UA: NEGATIVE
NITRITE UA: NEGATIVE
ODOR: NEGATIVE
PROTEIN UA: NEGATIVE
Spec Grav, UA: 1.01 (ref 1.010–1.025)
Urobilinogen, UA: 0.2 E.U./dL
pH, UA: 6.5 (ref 5.0–8.0)

## 2017-05-19 NOTE — Progress Notes (Addendum)
ROB: Patient doing well.  Taking medications as directed.  Occasional heartburn.  Desires AFP with next visit.  Scheduled FAS with next visit.

## 2017-05-19 NOTE — Progress Notes (Signed)
ROB- no complaints.  

## 2017-05-25 ENCOUNTER — Encounter: Payer: Self-pay | Admitting: Obstetrics and Gynecology

## 2017-06-02 ENCOUNTER — Other Ambulatory Visit: Payer: Self-pay | Admitting: Obstetrics and Gynecology

## 2017-06-02 DIAGNOSIS — Z3689 Encounter for other specified antenatal screening: Secondary | ICD-10-CM

## 2017-06-12 ENCOUNTER — Encounter: Payer: Self-pay | Admitting: Obstetrics and Gynecology

## 2017-06-16 ENCOUNTER — Ambulatory Visit (INDEPENDENT_AMBULATORY_CARE_PROVIDER_SITE_OTHER): Payer: 59

## 2017-06-16 ENCOUNTER — Ambulatory Visit: Payer: 59

## 2017-06-16 ENCOUNTER — Ambulatory Visit (INDEPENDENT_AMBULATORY_CARE_PROVIDER_SITE_OTHER): Payer: 59 | Admitting: Obstetrics and Gynecology

## 2017-06-16 VITALS — BP 89/55 | HR 71 | Wt 133.9 lb

## 2017-06-16 DIAGNOSIS — O09512 Supervision of elderly primigravida, second trimester: Secondary | ICD-10-CM

## 2017-06-16 DIAGNOSIS — Z3689 Encounter for other specified antenatal screening: Secondary | ICD-10-CM

## 2017-06-16 DIAGNOSIS — Z1379 Encounter for other screening for genetic and chromosomal anomalies: Secondary | ICD-10-CM

## 2017-06-16 DIAGNOSIS — Z3402 Encounter for supervision of normal first pregnancy, second trimester: Secondary | ICD-10-CM

## 2017-06-16 LAB — POCT URINALYSIS DIPSTICK
Bilirubin, UA: NEGATIVE
Blood, UA: NEGATIVE
Glucose, UA: NEGATIVE
Ketones, UA: NEGATIVE
Leukocytes, UA: NEGATIVE
Nitrite, UA: NEGATIVE
Protein, UA: NEGATIVE
Spec Grav, UA: <=1.005 — AB (ref 1.010–1.025)
Urobilinogen, UA: 0.2 E.U./dL
pH, UA: 7.5 (ref 5.0–8.0)

## 2017-06-16 NOTE — Progress Notes (Signed)
ROB: Complains of nose bleeds when blowing her nose. Advised on nasal saline spray.  S/p normal anatomy scan, AFP done today. Encouraged signing up for childbirth classes. RTC in 4 weeks.

## 2017-06-16 NOTE — Progress Notes (Signed)
ROB-pt stated that she was going well but pt stated that she has been having nose bleeds noticed only when she blows her nose. No other concerns.

## 2017-06-16 NOTE — Patient Instructions (Signed)
Second Trimester of Pregnancy The second trimester is from week 13 through week 28, month 4 through 6. This is often the time in pregnancy that you feel your best. Often times, morning sickness has lessened or quit. You may have more energy, and you may get hungry more often. Your unborn baby (fetus) is growing rapidly. At the end of the sixth month, he or she is about 9 inches long and weighs about 1 pounds. You will likely feel the baby move (quickening) between 18 and 20 weeks of pregnancy. Follow these instructions at home:  Avoid all smoking, herbs, and alcohol. Avoid drugs not approved by your doctor.  Do not use any tobacco products, including cigarettes, chewing tobacco, and electronic cigarettes. If you need help quitting, ask your doctor. You may get counseling or other support to help you quit.  Only take medicine as told by your doctor. Some medicines are safe and some are not during pregnancy.  Exercise only as told by your doctor. Stop exercising if you start having cramps.  Eat regular, healthy meals.  Wear a good support bra if your breasts are tender.  Do not use hot tubs, steam rooms, or saunas.  Wear your seat belt when driving.  Avoid raw meat, uncooked cheese, and liter boxes and soil used by cats.  Take your prenatal vitamins.  Take 1500-2000 milligrams of calcium daily starting at the 20th week of pregnancy until you deliver your baby.  Try taking medicine that helps you poop (stool softener) as needed, and if your doctor approves. Eat more fiber by eating fresh fruit, vegetables, and whole grains. Drink enough fluids to keep your pee (urine) clear or pale yellow.  Take warm water baths (sitz baths) to soothe pain or discomfort caused by hemorrhoids. Use hemorrhoid cream if your doctor approves.  If you have puffy, bulging veins (varicose veins), wear support hose. Raise (elevate) your feet for 15 minutes, 3-4 times a day. Limit salt in your diet.  Avoid heavy  lifting, wear low heals, and sit up straight.  Rest with your legs raised if you have leg cramps or low back pain.  Visit your dentist if you have not gone during your pregnancy. Use a soft toothbrush to brush your teeth. Be gentle when you floss.  You can have sex (intercourse) unless your doctor tells you not to.  Go to your doctor visits. Get help if:  You feel dizzy.  You have mild cramps or pressure in your lower belly (abdomen).  You have a nagging pain in your belly area.  You continue to feel sick to your stomach (nauseous), throw up (vomit), or have watery poop (diarrhea).  You have bad smelling fluid coming from your vagina.  You have pain with peeing (urination). Get help right away if:  You have a fever.  You are leaking fluid from your vagina.  You have spotting or bleeding from your vagina.  You have severe belly cramping or pain.  You lose or gain weight rapidly.  You have trouble catching your breath and have chest pain.  You notice sudden or extreme puffiness (swelling) of your face, hands, ankles, feet, or legs.  You have not felt the baby move in over an hour.  You have severe headaches that do not go away with medicine.  You have vision changes. This information is not intended to replace advice given to you by your health care provider. Make sure you discuss any questions you have with your health care   provider. Document Released: 03/19/2009 Document Revised: 05/31/2015 Document Reviewed: 02/24/2012 Elsevier Interactive Patient Education  2017 Elsevier Inc.  

## 2017-06-18 LAB — AFP, SERUM, OPEN SPINA BIFIDA
AFP MoM: 1.2
AFP VALUE AFPOSL: 56.4 ng/mL
Gest. Age on Collection Date: 18.1 weeks
Maternal Age At EDD: 35 yr
OSBR RISK 1 IN: 6499
Test Results:: NEGATIVE
WEIGHT: 131 [lb_av]

## 2017-07-17 ENCOUNTER — Ambulatory Visit (INDEPENDENT_AMBULATORY_CARE_PROVIDER_SITE_OTHER): Payer: 59 | Admitting: Obstetrics and Gynecology

## 2017-07-17 ENCOUNTER — Encounter: Payer: Self-pay | Admitting: Obstetrics and Gynecology

## 2017-07-17 VITALS — BP 103/66 | HR 94 | Wt 139.1 lb

## 2017-07-17 DIAGNOSIS — Z3402 Encounter for supervision of normal first pregnancy, second trimester: Secondary | ICD-10-CM | POA: Diagnosis not present

## 2017-07-17 LAB — POCT URINALYSIS DIPSTICK
Bilirubin, UA: NEGATIVE
Blood, UA: NEGATIVE
Glucose, UA: NEGATIVE
KETONES UA: NEGATIVE
Leukocytes, UA: NEGATIVE
NITRITE UA: NEGATIVE
ODOR: NEGATIVE
Protein, UA: NEGATIVE
Spec Grav, UA: <=1.005 — AB (ref 1.010–1.025)
UROBILINOGEN UA: 0.2 U/dL
pH, UA: 7.5 (ref 5.0–8.0)

## 2017-07-17 NOTE — Progress Notes (Signed)
ROB:  No problems.  Active daily movement.  1 hr GCT next visit.

## 2017-07-17 NOTE — Progress Notes (Signed)
ROB- no complaints.  

## 2017-08-19 ENCOUNTER — Encounter: Payer: Self-pay | Admitting: Obstetrics and Gynecology

## 2017-08-19 ENCOUNTER — Other Ambulatory Visit: Payer: 59

## 2017-08-19 ENCOUNTER — Ambulatory Visit (INDEPENDENT_AMBULATORY_CARE_PROVIDER_SITE_OTHER): Payer: 59 | Admitting: Obstetrics and Gynecology

## 2017-08-19 VITALS — BP 85/47 | HR 81 | Wt 146.8 lb

## 2017-08-19 DIAGNOSIS — Z23 Encounter for immunization: Secondary | ICD-10-CM

## 2017-08-19 DIAGNOSIS — Z131 Encounter for screening for diabetes mellitus: Secondary | ICD-10-CM

## 2017-08-19 DIAGNOSIS — O2653 Maternal hypotension syndrome, third trimester: Secondary | ICD-10-CM

## 2017-08-19 DIAGNOSIS — Z3A28 28 weeks gestation of pregnancy: Secondary | ICD-10-CM | POA: Diagnosis not present

## 2017-08-19 DIAGNOSIS — Z130 Encounter for screening for diseases of the blood and blood-forming organs and certain disorders involving the immune mechanism: Secondary | ICD-10-CM

## 2017-08-19 DIAGNOSIS — O09819 Supervision of pregnancy resulting from assisted reproductive technology, unspecified trimester: Secondary | ICD-10-CM

## 2017-08-19 DIAGNOSIS — O09813 Supervision of pregnancy resulting from assisted reproductive technology, third trimester: Secondary | ICD-10-CM

## 2017-08-19 DIAGNOSIS — O09523 Supervision of elderly multigravida, third trimester: Secondary | ICD-10-CM

## 2017-08-19 LAB — POCT URINALYSIS DIPSTICK
BILIRUBIN UA: NEGATIVE
Blood, UA: NEGATIVE
Glucose, UA: NEGATIVE
KETONES UA: NEGATIVE
Leukocytes, UA: NEGATIVE
Nitrite, UA: NEGATIVE
PH UA: 8 (ref 5.0–8.0)
Protein, UA: NEGATIVE
Spec Grav, UA: <=1.005 — AB (ref 1.010–1.025)
UROBILINOGEN UA: 0.2 U/dL

## 2017-08-19 MED ORDER — TETANUS-DIPHTH-ACELL PERTUSSIS 5-2.5-18.5 LF-MCG/0.5 IM SUSP
0.5000 mL | Freq: Once | INTRAMUSCULAR | Status: AC
  Administered 2017-08-19: 0.5 mL via INTRAMUSCULAR

## 2017-08-19 NOTE — Progress Notes (Signed)
ROB-pt stated that she was doing well. Pt stated that had her tdap shot in November 2018.

## 2017-08-19 NOTE — Patient Instructions (Signed)

## 2017-08-19 NOTE — Progress Notes (Signed)
ROB: Doing well, no complaints but does note some lightheadedness after drinking glucola liquid.  BPs low normal today. To reiterate again next visit.  For 28 week labs today.  Desires to breastfeed, desires unsure method for contraception (however likely will decline contraception as she required reproductive assistance).  For Tdap today, signed blood consent. Patient inquires as to if she can have a primary C-section (notes concerns for vaginal tears and other complications from vaginal delivery such as need for instrumentation).  Discussed concerns with patient.  Stressed that vaginal delivery was preferred as less overall risk, and faster recovery. Discussed concerns with tears as with use of perineal massage and stretching. Advised that instrumentation (vacuum or forceps) could be required for either type of delivery. After discussion, patient is a little more comfortable with vaginal delivery. Can continue discussion at future visits if needed.  RTC in 2 weeks.

## 2017-08-20 LAB — CBC
HEMOGLOBIN: 11.1 g/dL (ref 11.1–15.9)
Hematocrit: 32.7 % — ABNORMAL LOW (ref 34.0–46.6)
MCH: 32.8 pg (ref 26.6–33.0)
MCHC: 33.9 g/dL (ref 31.5–35.7)
MCV: 97 fL (ref 79–97)
Platelets: 196 10*3/uL (ref 150–450)
RBC: 3.38 x10E6/uL — ABNORMAL LOW (ref 3.77–5.28)
RDW: 14.1 % (ref 12.3–15.4)
WBC: 6.4 10*3/uL (ref 3.4–10.8)

## 2017-08-20 LAB — RPR: RPR Ser Ql: NONREACTIVE

## 2017-08-20 LAB — GLUCOSE, 1 HOUR GESTATIONAL: Gestational Diabetes Screen: 83 mg/dL (ref 65–139)

## 2017-09-02 ENCOUNTER — Ambulatory Visit (INDEPENDENT_AMBULATORY_CARE_PROVIDER_SITE_OTHER): Payer: 59 | Admitting: Obstetrics and Gynecology

## 2017-09-02 ENCOUNTER — Encounter: Payer: Self-pay | Admitting: Obstetrics and Gynecology

## 2017-09-02 ENCOUNTER — Telehealth: Payer: Self-pay

## 2017-09-02 VITALS — BP 99/63 | HR 76 | Wt 147.0 lb

## 2017-09-02 DIAGNOSIS — O09523 Supervision of elderly multigravida, third trimester: Secondary | ICD-10-CM

## 2017-09-02 LAB — POCT URINALYSIS DIPSTICK OB
BILIRUBIN UA: NEGATIVE
Glucose, UA: NEGATIVE
KETONES UA: NEGATIVE
Leukocytes, UA: NEGATIVE
Nitrite, UA: NEGATIVE
PH UA: 7.5 (ref 5.0–8.0)
POC,PROTEIN,UA: NEGATIVE
RBC UA: NEGATIVE
Spec Grav, UA: 1.01 (ref 1.010–1.025)
UROBILINOGEN UA: 0.2 U/dL

## 2017-09-02 NOTE — Telephone Encounter (Signed)
Informed Pt that FMLA papers have been filled out and faxed.

## 2017-09-02 NOTE — Progress Notes (Signed)
Pt presents today for routine prenatal check up. Pt is doing well and has no concerns.

## 2017-09-02 NOTE — Progress Notes (Signed)
ROB: Patient still considering primary cesarean delivery.  We talked about this in great detail.  Advantages of vaginal birth discussed.  Fundal height slightly lagging we will continue to watch and consider ultrasound if this worsens.

## 2017-09-22 ENCOUNTER — Encounter: Payer: Self-pay | Admitting: Obstetrics and Gynecology

## 2017-09-22 ENCOUNTER — Ambulatory Visit: Payer: 59 | Admitting: Obstetrics and Gynecology

## 2017-09-22 ENCOUNTER — Encounter: Payer: 59 | Admitting: Obstetrics and Gynecology

## 2017-09-22 VITALS — BP 103/57 | HR 72 | Wt 154.4 lb

## 2017-09-22 DIAGNOSIS — O09523 Supervision of elderly multigravida, third trimester: Secondary | ICD-10-CM | POA: Diagnosis not present

## 2017-09-22 DIAGNOSIS — O09819 Supervision of pregnancy resulting from assisted reproductive technology, unspecified trimester: Secondary | ICD-10-CM

## 2017-09-22 LAB — POCT URINALYSIS DIPSTICK OB
BILIRUBIN UA: NEGATIVE
Blood, UA: NEGATIVE
GLUCOSE, UA: NEGATIVE
KETONES UA: NEGATIVE
Leukocytes, UA: NEGATIVE
Nitrite, UA: NEGATIVE
POC,PROTEIN,UA: NEGATIVE
SPEC GRAV UA: 1.01 (ref 1.010–1.025)
Urobilinogen, UA: 0.2 E.U./dL
pH, UA: 6.5 (ref 5.0–8.0)

## 2017-09-22 NOTE — Progress Notes (Signed)
  ROB-PT stated that she is doing well no complaints. Declines flu vaccine in office, will get it at her job.  Discussed finding a pediatrician. Inquires regarding benefits/risks of 3D/4D ultrasound.  All questions answered.  RTC in 2 weeks.

## 2017-09-22 NOTE — Patient Instructions (Signed)
[facility]  [facility]  [address], [place], [place] [address]  Phone: [phone]  [facility] (second location)  [address] [place], [place] [address]  Phone: [phone]  [facility] ([place]) [address], [place], [place] [address] Phone: [phone]  [facility]  [address] [place], [place] [address]  Phone: [phone] 

## 2017-10-05 NOTE — Progress Notes (Signed)
Pt here today for ROB visit.  Pt is feeling fine and has no concerns.

## 2017-10-06 ENCOUNTER — Ambulatory Visit (INDEPENDENT_AMBULATORY_CARE_PROVIDER_SITE_OTHER): Payer: 59 | Admitting: Obstetrics and Gynecology

## 2017-10-06 ENCOUNTER — Encounter: Payer: Self-pay | Admitting: Obstetrics and Gynecology

## 2017-10-06 VITALS — BP 102/65 | HR 73 | Wt 158.0 lb

## 2017-10-06 DIAGNOSIS — O09819 Supervision of pregnancy resulting from assisted reproductive technology, unspecified trimester: Secondary | ICD-10-CM

## 2017-10-06 DIAGNOSIS — O09523 Supervision of elderly multigravida, third trimester: Secondary | ICD-10-CM

## 2017-10-06 LAB — POCT URINALYSIS DIPSTICK OB
Bilirubin, UA: NEGATIVE
Blood, UA: NEGATIVE
Glucose, UA: NEGATIVE
Ketones, UA: NEGATIVE
Leukocytes, UA: NEGATIVE
NITRITE UA: NEGATIVE
POC,PROTEIN,UA: NEGATIVE
Spec Grav, UA: 1.01 (ref 1.010–1.025)
Urobilinogen, UA: 0.2 E.U./dL
pH, UA: 6.5 (ref 5.0–8.0)

## 2017-10-06 NOTE — Progress Notes (Signed)
ROB:   Patient desires primary cesarean delivery.  She would like to finalize this decision at her next prenatal visit.  She continues to take aspirin as directed.  Needs cultures and 35-week labs next visit.

## 2017-10-14 ENCOUNTER — Encounter: Payer: Self-pay | Admitting: Obstetrics and Gynecology

## 2017-10-14 ENCOUNTER — Ambulatory Visit (INDEPENDENT_AMBULATORY_CARE_PROVIDER_SITE_OTHER): Payer: 59 | Admitting: Obstetrics and Gynecology

## 2017-10-14 VITALS — BP 113/65 | HR 85 | Wt 150.0 lb

## 2017-10-14 DIAGNOSIS — O09819 Supervision of pregnancy resulting from assisted reproductive technology, unspecified trimester: Secondary | ICD-10-CM | POA: Diagnosis not present

## 2017-10-14 DIAGNOSIS — O09523 Supervision of elderly multigravida, third trimester: Secondary | ICD-10-CM

## 2017-10-14 LAB — POCT URINALYSIS DIPSTICK OB
Bilirubin, UA: NEGATIVE
Blood, UA: NEGATIVE
Glucose, UA: NEGATIVE
KETONES UA: NEGATIVE
LEUKOCYTES UA: NEGATIVE
NITRITE UA: NEGATIVE
PH UA: 7.5 (ref 5.0–8.0)
PROTEIN: NEGATIVE
Spec Grav, UA: 1.01 (ref 1.010–1.025)
UROBILINOGEN UA: 0.2 U/dL

## 2017-10-14 NOTE — Progress Notes (Signed)
ROB: GBS-GC/CT performed today.  Patient has decided on a primary cesarean delivery.  She has been thinking about this for weeks and she has no doubts regarding this choice.  Risks and benefits discussed in detail.  Patient has chosen November 4th as her desired date for cesarean.  She realizes this is close to her due date and she may go in labor ahead of time.  She would still like a cesarean delivery if she does go into labor before this date.  Weight loss noted today.  Negative ketones.  Discussed more attention to daily caloric intake.

## 2017-10-17 LAB — GC/CHLAMYDIA PROBE AMP
CHLAMYDIA, DNA PROBE: NEGATIVE
Neisseria gonorrhoeae by PCR: NEGATIVE

## 2017-10-19 LAB — STREP GP B SUSCEPTIBILITY

## 2017-10-19 LAB — STREP GP B NAA+RFLX: Strep Gp B NAA+Rflx: POSITIVE — AB

## 2017-10-20 ENCOUNTER — Ambulatory Visit (INDEPENDENT_AMBULATORY_CARE_PROVIDER_SITE_OTHER): Payer: 59 | Admitting: Obstetrics and Gynecology

## 2017-10-20 VITALS — BP 109/71 | HR 92 | Wt 162.3 lb

## 2017-10-20 DIAGNOSIS — Z3483 Encounter for supervision of other normal pregnancy, third trimester: Secondary | ICD-10-CM | POA: Diagnosis not present

## 2017-10-20 LAB — POCT URINALYSIS DIPSTICK OB
Bilirubin, UA: NEGATIVE
Blood, UA: NEGATIVE
Glucose, UA: NEGATIVE
KETONES UA: NEGATIVE
LEUKOCYTES UA: NEGATIVE
NITRITE UA: NEGATIVE
PH UA: 7 (ref 5.0–8.0)
PROTEIN: NEGATIVE
SPEC GRAV UA: 1.01 (ref 1.010–1.025)
UROBILINOGEN UA: 0.2 U/dL

## 2017-10-20 NOTE — Progress Notes (Signed)
ROB: Notes pelvic cramping for several days over the weekend, lasting for ~ 30 minutes.  Otherwise doing well. Patient is planning for primary C-section on 11/4. Reiterated labor precautions. Reviewed labs, GBS+, discussed need for antibiotics (but will have prophylactically for surgery). RTC in 1 week.

## 2017-10-20 NOTE — Patient Instructions (Signed)
Group B Streptococcus Infection During Pregnancy Group B Streptococcus (GBS) is a type of bacteria (Streptococcus agalactiae) that is often found in healthy people, commonly in the rectum, vagina, and intestines. In people who are healthy and not pregnant, the bacteria rarely cause serious illness or complications. However, women who test positive for GBS during pregnancy can pass the bacteria to their baby during childbirth, which can cause serious infection in the baby after birth. Women with GBS may also have infections during their pregnancy or immediately after childbirth, such as such as urinary tract infections (UTIs) or infections of the uterus (uterine infections). Having GBS also increases a woman's risk of complications during pregnancy, such as early (preterm) labor or delivery, miscarriage, or stillbirth. Routine testing (screening) for GBS is recommended for all pregnant women. What increases the risk? You may have a higher risk for GBS infection during pregnancy if you had one during a past pregnancy. What are the signs or symptoms? In most cases, GBS infection does not cause symptoms in pregnant women. Signs and symptoms of a possible GBS-related infection may include:  Labor starting before the 37th week of pregnancy.  A UTI or bladder infection, which may cause: ? Fever. ? Pain or burning during urination. ? Frequent urination.  Fever during labor, along with: ? Bad-smelling discharge. ? Uterine tenderness. ? Rapid heartbeat in the mother, baby, or both.  Rare but serious symptoms of a possible GBS-related infection in women include:  Blood infection (septicemia). This may cause fever, chills, or confusion.  Lung infection (pneumonia). This may cause fever, chills, cough, rapid breathing, difficulty breathing, or chest pain.  Bone, joint, skin, or soft tissue infection.  How is this diagnosed? You may be screened for GBS between week 35 and week 37 of your pregnancy. If  you have symptoms of preterm labor, you may be screened earlier. This condition is diagnosed based on lab test results from:  A swab of fluid from the vagina and rectum.  A urine sample.  How is this treated? This condition is treated with antibiotic medicine. When you go into labor, or as soon as your water breaks (your membranes rupture), you will be given antibiotics through an IV tube. Antibiotics will continue until after you give birth. If you are having a cesarean delivery, you do not need antibiotics unless your membranes have already ruptured. Follow these instructions at home:  Take over-the-counter and prescription medicines only as told by your health care provider.  Take your antibiotic medicine as told by your health care provider. Do not stop taking the antibiotic even if you start to feel better.  Keep all pre-birth (prenatal) visits and follow-up visits as told by your health care provider. This is important. Contact a health care provider if:  You have pain or burning when you urinate.  You have to urinate frequently.  You have a fever or chills.  You develop a bad-smelling vaginal discharge. Get help right away if:  Your membranes rupture.  You go into labor.  You have severe pain in your abdomen.  You have difficulty breathing.  You have chest pain. This information is not intended to replace advice given to you by your health care provider. Make sure you discuss any questions you have with your health care provider. Document Released: 04/01/2007 Document Revised: 07/20/2015 Document Reviewed: 07/19/2015 Elsevier Interactive Patient Education  2018 [employer]vier Inc[employer]  

## 2017-10-20 NOTE — Progress Notes (Signed)
ROB-Pt stated that she is having some cramping. In the lower abd area that last about 30 minutes on Saturday and Sunday and nothing else after that. No other complaints.

## 2017-10-29 ENCOUNTER — Ambulatory Visit (INDEPENDENT_AMBULATORY_CARE_PROVIDER_SITE_OTHER): Payer: 59 | Admitting: Obstetrics and Gynecology

## 2017-10-29 ENCOUNTER — Encounter: Payer: Self-pay | Admitting: Obstetrics and Gynecology

## 2017-10-29 VITALS — BP 120/68 | HR 81 | Wt 161.6 lb

## 2017-10-29 DIAGNOSIS — O09819 Supervision of pregnancy resulting from assisted reproductive technology, unspecified trimester: Secondary | ICD-10-CM

## 2017-10-29 DIAGNOSIS — Z3483 Encounter for supervision of other normal pregnancy, third trimester: Secondary | ICD-10-CM

## 2017-10-29 LAB — POCT URINALYSIS DIPSTICK OB
Bilirubin, UA: NEGATIVE
Glucose, UA: NEGATIVE
KETONES UA: NEGATIVE
NITRITE UA: NEGATIVE
PH UA: 6.5 (ref 5.0–8.0)
PROTEIN: NEGATIVE
RBC UA: NEGATIVE
UROBILINOGEN UA: 0.2 U/dL

## 2017-10-29 NOTE — Progress Notes (Signed)
ROB: Patient with possible rib cartilage strain.  She is uncomfortable but not disabled by this.  Reports active fetal movement.  Reaffirmed her desire for cesarean delivery.

## 2017-10-29 NOTE — Progress Notes (Signed)
ROB-pt stated that she heard her rib pop and now her rib is hurting. No other concerns.

## 2017-11-05 ENCOUNTER — Ambulatory Visit (INDEPENDENT_AMBULATORY_CARE_PROVIDER_SITE_OTHER): Payer: 59 | Admitting: Obstetrics and Gynecology

## 2017-11-05 ENCOUNTER — Encounter: Payer: 59 | Admitting: Obstetrics and Gynecology

## 2017-11-06 ENCOUNTER — Ambulatory Visit (INDEPENDENT_AMBULATORY_CARE_PROVIDER_SITE_OTHER): Payer: 59 | Admitting: Obstetrics and Gynecology

## 2017-11-06 ENCOUNTER — Other Ambulatory Visit: Payer: Self-pay

## 2017-11-06 ENCOUNTER — Encounter
Admission: RE | Admit: 2017-11-06 | Discharge: 2017-11-06 | Disposition: A | Discharge status: Home / Self Care | Payer: 59 | Source: Ambulatory Visit | Attending: Obstetrics and Gynecology | Admitting: Obstetrics and Gynecology

## 2017-11-06 VITALS — BP 116/81 | HR 76 | Wt 160.2 lb

## 2017-11-06 DIAGNOSIS — Z3A00 Weeks of gestation of pregnancy not specified: Secondary | ICD-10-CM

## 2017-11-06 DIAGNOSIS — Z01818 Encounter for other preprocedural examination: Secondary | ICD-10-CM

## 2017-11-06 DIAGNOSIS — Z3493 Encounter for supervision of normal pregnancy, unspecified, third trimester: Secondary | ICD-10-CM | POA: Diagnosis not present

## 2017-11-06 DIAGNOSIS — O09523 Supervision of elderly multigravida, third trimester: Secondary | ICD-10-CM | POA: Insufficient documentation

## 2017-11-06 HISTORY — DX: Other specified health status: Z78.9

## 2017-11-06 LAB — TYPE AND SCREEN
ABO/RH(D): A POS
ANTIBODY SCREEN: NEGATIVE
Extend sample reason: UNDETERMINED

## 2017-11-06 LAB — POCT URINALYSIS DIPSTICK OB
Bilirubin, UA: NEGATIVE
Blood, UA: NEGATIVE
Glucose, UA: NEGATIVE
Ketones, UA: NEGATIVE
LEUKOCYTES UA: NEGATIVE
NITRITE UA: NEGATIVE
PROTEIN: NEGATIVE
Spec Grav, UA: 1.01 (ref 1.010–1.025)
Urobilinogen, UA: 0.2 E.U./dL
pH, UA: 6.5 (ref 5.0–8.0)

## 2017-11-06 NOTE — Patient Instructions (Addendum)
  Your procedure is scheduled on: Monday November 09, 2017 Report to the [facility] Emergency Department at 5:30 am  Remember: Instructions that are not followed completely may result in serious medical risk, up to and including death, or upon the discretion of your surgeon and anesthesiologist your surgery may need to be rescheduled.    __x__ 1. Do not eat food (including mints, candies, chewing gum) after midnight the night before your procedure. You may drink clear liquids up to 2 hours before you are scheduled to arrive at the hospital for your procedure.  Do not drink anything within 2 hours of your scheduled arrival to the hospital.  Approved clear liquids:  --Water or Apple juice without pulp  --Clear carbohydrate beverage such as Gatorade or Powerade  --Black Coffee or Clear Tea (No milk, no creamers, do not add anything to the coffee or tea)    __x__ 2. No Alcohol for 24 hours before or after surgery.   __x__ 3. No Smoking or e-cigarettes for 24 hours before surgery.  Do not use any chewable tobacco products for at least 6 hours before surgery.   __x__ 4. Notify your doctor if there is any change in your medical condition (cold, fever, infections).   __x__ 5. On the morning of surgery brush your teeth with toothpaste and water.  You may rinse your mouth with mouthwash if you wish.  Do not swallow any toothpaste or mouthwash.  Please read over the following fact sheets that you were given:   [facility] Preparing for Surgery and/or MRSA Information    __x__ Use CHG Soap or Sage wipes as directed on instruction sheet   Do not wear jewelry, make-up, hairpins, clips or nail polish on the day of surgery.  Do not wear lotions, powders, deodorant, or perfumes.   Do not shave below the face/neck 48 hours prior to surgery.   Do not bring valuables to the hospital.    [facility] is not responsible for any belongings or valuables.               Contacts, dentures or bridgework may not be worn  into surgery.  For patients admitted to the hospital, discharge time is determined by your treatment team.  __x__ Take anti-hypertensive listed below, cardiac, seizure, asthma, anti-reflux and psychiatric medicines on the morning of surgery with a SMALL SIP OF WATER. These include:  1. Cetirizine (Zyrtec) if needed  __x__ Follow recommendations from Cardiologist, Pulmonologist or PCP regarding stopping Aspirin, Coumadin, Plavix, Eliquis, Effient, Pradaxa, and Pletal.  __x__ TODAY: Stop Anti-inflammatories such as Advil, Ibuprofen, Motrin, Aleve, Naproxen, Naprosyn, BC/Goodies powders or aspirin products. You may continue to take Tylenol and Celebrex.   __x__ TODAY: Stop supplements until after surgery. You may continue to take Vitamin D, Vitamin B, and multivitamin.

## 2017-11-06 NOTE — Progress Notes (Signed)
ROB- pt is doing well, denies any pressure 

## 2017-11-06 NOTE — Progress Notes (Signed)
ROB work-in from reschedule-doing well, stopped ASA last week. Planned c/s on Monday.

## 2017-11-09 ENCOUNTER — Inpatient Hospital Stay
Admission: RE | Admit: 2017-11-09 | Discharge: 2017-11-12 | DRG: 788 | Disposition: A | Discharge status: Home / Self Care | Payer: 59 | Attending: Obstetrics and Gynecology | Admitting: Obstetrics and Gynecology

## 2017-11-09 ENCOUNTER — Inpatient Hospital Stay: Payer: 59 | Admitting: Certified Registered Nurse Anesthetist

## 2017-11-09 ENCOUNTER — Other Ambulatory Visit: Payer: Self-pay

## 2017-11-09 ENCOUNTER — Encounter: Payer: Self-pay | Admitting: Certified Registered Nurse Anesthetist

## 2017-11-09 ENCOUNTER — Encounter
Admission: RE | Disposition: A | Discharge status: Home / Self Care | Payer: Self-pay | Source: Home / Self Care | Attending: Obstetrics and Gynecology

## 2017-11-09 DIAGNOSIS — Z91040 Latex allergy status: Secondary | ICD-10-CM

## 2017-11-09 DIAGNOSIS — Z3A39 39 weeks gestation of pregnancy: Secondary | ICD-10-CM

## 2017-11-09 DIAGNOSIS — Z3490 Encounter for supervision of normal pregnancy, unspecified, unspecified trimester: Secondary | ICD-10-CM

## 2017-11-09 DIAGNOSIS — Z880 Allergy status to penicillin: Secondary | ICD-10-CM

## 2017-11-09 SURGERY — Surgical Case
Anesthesia: Spinal

## 2017-11-09 MED ORDER — CLINDAMYCIN PHOSPHATE 900 MG/50ML IV SOLN
900.0000 mg | INTRAVENOUS | Status: AC
  Administered 2017-11-09: 900 mg via INTRAVENOUS
  Filled 2017-11-09: qty 50

## 2017-11-09 MED ORDER — PRENATAL MULTIVITAMIN CH
1.0000 | ORAL_TABLET | Freq: Every day | ORAL | Status: DC
  Administered 2017-11-09 – 2017-11-11 (×3): 1 via ORAL
  Filled 2017-11-09 (×3): qty 1

## 2017-11-09 MED ORDER — LIDOCAINE 5 % EX PTCH
MEDICATED_PATCH | CUTANEOUS | Status: AC
  Filled 2017-11-09: qty 1

## 2017-11-09 MED ORDER — DIPHENHYDRAMINE HCL 50 MG/ML IJ SOLN: 12.5000 mg | INTRAMUSCULAR | Status: DC | PRN

## 2017-11-09 MED ORDER — NALBUPHINE HCL 10 MG/ML IJ SOLN: 5.0000 mg | INTRAMUSCULAR | Status: DC | PRN

## 2017-11-09 MED ORDER — LACTATED RINGERS IV SOLN
INTRAVENOUS | Status: DC
  Administered 2017-11-09: via INTRAVENOUS

## 2017-11-09 MED ORDER — NALOXONE HCL 4 MG/10ML IJ SOLN
1.0000 ug/kg/h | INTRAVENOUS | Status: DC | PRN
  Filled 2017-11-09: qty 5

## 2017-11-09 MED ORDER — ONDANSETRON HCL 4 MG/2ML IJ SOLN: 4.0000 mg | Freq: Once | INTRAMUSCULAR | Status: DC | PRN

## 2017-11-09 MED ORDER — FENTANYL CITRATE (PF) 100 MCG/2ML IJ SOLN: 25.0000 ug | INTRAMUSCULAR | Status: DC | PRN

## 2017-11-09 MED ORDER — ACETAMINOPHEN 500 MG PO TABS
1000.0000 mg | ORAL_TABLET | Freq: Four times a day (QID) | ORAL | Status: AC
  Administered 2017-11-09 – 2017-11-10 (×2): 1000 mg via ORAL
  Filled 2017-11-09 (×2): qty 2

## 2017-11-09 MED ORDER — NALOXONE HCL 0.4 MG/ML IJ SOLN: 0.4000 mg | INTRAMUSCULAR | Status: DC | PRN

## 2017-11-09 MED ORDER — ZOLPIDEM TARTRATE 5 MG PO TABS: 5.0000 mg | ORAL_TABLET | Freq: Every evening | ORAL | Status: DC | PRN

## 2017-11-09 MED ORDER — OXYCODONE-ACETAMINOPHEN 5-325 MG PO TABS: 2.0000 | ORAL_TABLET | ORAL | Status: DC | PRN

## 2017-11-09 MED ORDER — IBUPROFEN 600 MG PO TABS
600.0000 mg | ORAL_TABLET | Freq: Four times a day (QID) | ORAL | Status: DC
  Administered 2017-11-09 – 2017-11-12 (×10): 600 mg via ORAL
  Filled 2017-11-09 (×10): qty 1

## 2017-11-09 MED ORDER — ACETAMINOPHEN 10 MG/ML IV SOLN
INTRAVENOUS | Status: AC
  Filled 2017-11-09: qty 100

## 2017-11-09 MED ORDER — BUPIVACAINE IN DEXTROSE 0.75-8.25 % IT SOLN
INTRATHECAL | Status: DC | PRN
  Administered 2017-11-09: 1.6 mL via INTRATHECAL

## 2017-11-09 MED ORDER — NITROFURANTOIN MONOHYD MACRO 100 MG PO CAPS
100.0000 mg | ORAL_CAPSULE | Freq: Every day | ORAL | Status: DC
  Administered 2017-11-09 – 2017-11-11 (×3): 100 mg via ORAL
  Filled 2017-11-09 (×4): qty 1

## 2017-11-09 MED ORDER — SODIUM CHLORIDE 0.9 % IV SOLN
INTRAVENOUS | Status: DC | PRN
  Administered 2017-11-09: 50 ug/min via INTRAVENOUS

## 2017-11-09 MED ORDER — ACETAMINOPHEN 325 MG PO TABS: 650.0000 mg | ORAL_TABLET | ORAL | Status: DC | PRN

## 2017-11-09 MED ORDER — SENNOSIDES-DOCUSATE SODIUM 8.6-50 MG PO TABS
2.0000 | ORAL_TABLET | ORAL | Status: DC
  Administered 2017-11-09 – 2017-11-11 (×3): 2 via ORAL
  Filled 2017-11-09 (×4): qty 2

## 2017-11-09 MED ORDER — PHENYLEPHRINE HCL 10 MG/ML IJ SOLN
INTRAMUSCULAR | Status: AC
  Filled 2017-11-09: qty 1

## 2017-11-09 MED ORDER — OXYTOCIN 40 UNITS IN LACTATED RINGERS INFUSION - SIMPLE MED
INTRAVENOUS | Status: AC
  Administered 2017-11-09: 2.5 [IU]/h via INTRAVENOUS
  Filled 2017-11-09: qty 1000

## 2017-11-09 MED ORDER — OXYTOCIN 40 UNITS IN LACTATED RINGERS INFUSION - SIMPLE MED
INTRAVENOUS | Status: DC | PRN
  Administered 2017-11-09 (×2): 100 mL via INTRAVENOUS
  Administered 2017-11-09: 500 mL via INTRAVENOUS

## 2017-11-09 MED ORDER — MORPHINE SULFATE (PF) 0.5 MG/ML IJ SOLN
INTRAMUSCULAR | Status: DC | PRN
  Administered 2017-11-09: .02 mg via EPIDURAL

## 2017-11-09 MED ORDER — ACETAMINOPHEN 10 MG/ML IV SOLN
INTRAVENOUS | Status: DC | PRN
  Administered 2017-11-09: 1000 mg via INTRAVENOUS

## 2017-11-09 MED ORDER — ACETAMINOPHEN 500 MG PO TABS
1000.0000 mg | ORAL_TABLET | Freq: Four times a day (QID) | ORAL | Status: DC
  Administered 2017-11-09: 1000 mg via ORAL
  Filled 2017-11-09: qty 2

## 2017-11-09 MED ORDER — EPHEDRINE SULFATE 50 MG/ML IJ SOLN
INTRAMUSCULAR | Status: AC
  Filled 2017-11-09: qty 1

## 2017-11-09 MED ORDER — NALBUPHINE HCL 10 MG/ML IJ SOLN: 5.0000 mg | Freq: Once | INTRAMUSCULAR | Status: DC | PRN

## 2017-11-09 MED ORDER — MENTHOL 3 MG MT LOZG
1.0000 | LOZENGE | OROMUCOSAL | Status: DC | PRN
  Filled 2017-11-09: qty 9

## 2017-11-09 MED ORDER — DIPHENHYDRAMINE HCL 25 MG PO CAPS: 25.0000 mg | ORAL_CAPSULE | ORAL | Status: DC | PRN

## 2017-11-09 MED ORDER — SODIUM CHLORIDE 0.9% FLUSH: 3.0000 mL | INTRAVENOUS | Status: DC | PRN

## 2017-11-09 MED ORDER — OXYTOCIN 40 UNITS IN LACTATED RINGERS INFUSION - SIMPLE MED
2.5000 [IU]/h | INTRAVENOUS | Status: AC
  Administered 2017-11-09: 2.5 [IU]/h via INTRAVENOUS

## 2017-11-09 MED ORDER — LACTATED RINGERS IV SOLN
INTRAVENOUS | Status: DC
  Administered 2017-11-09: 06:00:00 via INTRAVENOUS

## 2017-11-09 MED ORDER — GENTAMICIN SULFATE 40 MG/ML IJ SOLN
5.0000 mg/kg | INTRAVENOUS | Status: AC
  Administered 2017-11-09: 360 mg via INTRAVENOUS
  Filled 2017-11-09: qty 9

## 2017-11-09 MED ORDER — KETOROLAC TROMETHAMINE 30 MG/ML IJ SOLN
30.0000 mg | Freq: Four times a day (QID) | INTRAMUSCULAR | Status: AC | PRN
  Administered 2017-11-09 – 2017-11-10 (×2): 30 mg via INTRAVENOUS
  Filled 2017-11-09 (×2): qty 1

## 2017-11-09 MED ORDER — LIDOCAINE 5 % EX PTCH
MEDICATED_PATCH | CUTANEOUS | Status: DC | PRN
  Administered 2017-11-09: 1 via TRANSDERMAL

## 2017-11-09 MED ORDER — MORPHINE SULFATE (PF) 0.5 MG/ML IJ SOLN
INTRAMUSCULAR | Status: AC
  Filled 2017-11-09: qty 10

## 2017-11-09 MED ORDER — LACTATED RINGERS IV SOLN
Freq: Once | INTRAVENOUS | Status: AC
  Administered 2017-11-09: 06:00:00 via INTRAVENOUS

## 2017-11-09 MED ORDER — SOD CITRATE-CITRIC ACID 500-334 MG/5ML PO SOLN
ORAL | Status: AC
  Administered 2017-11-09: 30 mL via ORAL
  Filled 2017-11-09: qty 30

## 2017-11-09 MED ORDER — OXYCODONE-ACETAMINOPHEN 5-325 MG PO TABS
1.0000 | ORAL_TABLET | ORAL | Status: DC | PRN
  Administered 2017-11-10: 1 via ORAL
  Filled 2017-11-09: qty 1

## 2017-11-09 MED ORDER — FAMOTIDINE 20 MG PO TABS
20.0000 mg | ORAL_TABLET | Freq: Once | ORAL | Status: AC
  Administered 2017-11-09: 20 mg via ORAL
  Filled 2017-11-09: qty 1

## 2017-11-09 MED ORDER — SIMETHICONE 80 MG PO CHEW
80.0000 mg | CHEWABLE_TABLET | Freq: Four times a day (QID) | ORAL | Status: DC
  Administered 2017-11-09 – 2017-11-11 (×11): 80 mg via ORAL
  Filled 2017-11-09 (×12): qty 1

## 2017-11-09 MED ORDER — DIPHENHYDRAMINE HCL 25 MG PO CAPS: 25.0000 mg | ORAL_CAPSULE | Freq: Four times a day (QID) | ORAL | Status: DC | PRN

## 2017-11-09 MED ORDER — KETOROLAC TROMETHAMINE 30 MG/ML IJ SOLN: 30.0000 mg | Freq: Four times a day (QID) | INTRAMUSCULAR | Status: AC | PRN

## 2017-11-09 MED ORDER — SOD CITRATE-CITRIC ACID 500-334 MG/5ML PO SOLN
30.0000 mL | ORAL | Status: AC
  Administered 2017-11-09: 30 mL via ORAL

## 2017-11-09 MED ORDER — OXYTOCIN 40 UNITS IN LACTATED RINGERS INFUSION - SIMPLE MED
INTRAVENOUS | Status: AC
  Filled 2017-11-09: qty 1000

## 2017-11-09 SURGICAL SUPPLY — 27 items
ADHESIVE MASTISOL STRL (MISCELLANEOUS) ×3 IMPLANT
BAG COUNTER SPONGE EZ (MISCELLANEOUS) ×2 IMPLANT
CANISTER SUCT 3000ML PPV (MISCELLANEOUS) ×3 IMPLANT
CHLORAPREP W/TINT 26ML (MISCELLANEOUS) ×6 IMPLANT
COUNTER SPONGE BAG EZ (MISCELLANEOUS) ×1
COVER WAND RF STERILE (DRAPES) ×3 IMPLANT
DRSG TELFA 3X8 NADH (GAUZE/BANDAGES/DRESSINGS) ×3 IMPLANT
GAUZE SPONGE 4X4 12PLY STRL (GAUZE/BANDAGES/DRESSINGS) ×3 IMPLANT
GLOVE BIOGEL PI ORTHO PRO 7.5 (GLOVE) ×2
GLOVE PI ORTHO PRO STRL 7.5 (GLOVE) ×1 IMPLANT
GOWN STRL REUS W/ TWL LRG LVL3 (GOWN DISPOSABLE) ×2 IMPLANT
GOWN STRL REUS W/TWL LRG LVL3 (GOWN DISPOSABLE) ×4
KIT TURNOVER KIT A (KITS) ×3 IMPLANT
NS IRRIG 1000ML POUR BTL (IV SOLUTION) ×3 IMPLANT
PACK C SECTION AR (MISCELLANEOUS) ×3 IMPLANT
PAD OB MATERNITY 4.3X12.25 (PERSONAL CARE ITEMS) ×3 IMPLANT
PAD PREP 24X41 OB/GYN DISP (PERSONAL CARE ITEMS) ×3 IMPLANT
RETRACTOR WND ALEXIS-O 25 LRG (MISCELLANEOUS) ×1 IMPLANT
RTRCTR WOUND ALEXIS O 25CM LRG (MISCELLANEOUS) ×3
SPONGE LAP 18X18 RF (DISPOSABLE) IMPLANT
SUT VIC AB 0 CTX 36 (SUTURE) ×4
SUT VIC AB 0 CTX36XBRD ANBCTRL (SUTURE) ×2 IMPLANT
SUT VIC AB 1 CT1 36 (SUTURE) ×6 IMPLANT
SUT VIC AB 3-0 SH 27 (SUTURE) ×2
SUT VIC AB 3-0 SH 27X BRD (SUTURE) ×1 IMPLANT
SUT VIC AB 4-0 FS2 27 (SUTURE) ×3 IMPLANT
SUT VICRYL+ 3-0 36IN CT-1 (SUTURE) ×6 IMPLANT

## 2017-11-09 NOTE — Plan of Care (Signed)
Pt arrived with POC for elective cesarean delivery of baby girl. Wishes to bottle feed, FOB will be present at delivery in [place].

## 2017-11-09 NOTE — Plan of Care (Addendum)
Postpartum c/section, recovery. Discussed plan of care and postpartum pain management. Bonding well with newborn. Patient wishes to bottle feed at this time.

## 2017-11-09 NOTE — H&P (Signed)
History and Physical   HPI  Debbie Harding is a 37 y.o. G2P0010 at [redacted]w[redacted]d Estimated Date of Delivery: 11/11/17 who is being admitted for  C-section    OB History  OB History  Gravida Para Term Preterm AB Living  2 0 0 0 1 0  SAB TAB Ectopic Multiple Live Births  1 0 0 0 0    # Outcome Date GA Lbr Len/2nd Weight Sex Delivery Anes PTL Lv  2 Current           1 SAB 2018            PROBLEM LIST  Pregnancy complications or risks: Patient Active Problem List   Diagnosis Date Noted  . Term pregnancy 11/09/2017  . Advanced maternal age in multigravida 04/20/2017  . Encounter for supervision of pregnancy resulting from assisted reproductive technology 04/20/2017  . Family history of cancer 07/02/2015     Prenatal labs and studies: ABO, Rh: --/--/A POS (11/04 0606) Antibody: NEG (11/04 0606) Rubella: 2.16 (04/05 1009) RPR: Non Reactive (08/14 0922)  HBsAg: Negative (04/05 1009)  HIV: Non Reactive (04/05 1009)  GBS:    Past Medical History:  Diagnosis Date  . Medical history non-contributory      Past Surgical History:  Procedure Laterality Date  . SKIN TAG REMOVAL  2018  . VENOUS THROMBECTOMY Left   . WISDOM TOOTH EXTRACTION  1998     Medications    Current Discharge Medication List    CONTINUE these medications which have NOT CHANGED   Details  cetirizine (ZYRTEC) 10 MG tablet Take 10 mg by mouth daily as needed for allergies.     Prenatal Vit-Fe Fumarate-FA (MULTIVITAMIN-PRENATAL) 27-0.8 MG TABS tablet Take 1 tablet by mouth daily at 12 noon.    aspirin EC 81 MG tablet Take 1 tablet (81 mg total) by mouth daily. Take after 12 weeks for prevention of preeclampssia later in pregnancy Qty: 300 tablet, Refills: 2         Allergies  Latex; Penicillins; Codeine; and Sulfur  Review of Systems  Pertinent items are noted in HPI.  Physical Exam  BP 117/77 (BP Location: Left Arm)   Pulse 87   Temp 98.1 F (36.7 C) (Oral)   Resp 18   LMP  02/04/2017   Lungs:  CTA B Cardio: RRR without M/R/G Abd: Soft, gravid, NT Presentation: cephalic EXT: No C/C/ 1+ Edema DTRs: 2+ B CERVIX:     See Prenatal records for more detailed PE.     FHR:  Variability: Good {> 6 bpm)    Test Results  Results for orders placed or performed during the hospital encounter of 11/09/17 (from the past 24 hour(s))  Type and screen     Status: None   Collection Time: 11/09/17  6:06 AM  Result Value Ref Range   ABO/RH(D) A POS    Antibody Screen NEG    Sample Expiration      11/12/2017 Performed at [facility], [address] [place], [place] [address]      Assessment   G2P0010 at [redacted]w[redacted]d Estimated Date of Delivery: 11/11/17  The fetus is reassuring.    Patient Active Problem List   Diagnosis Date Noted  . Term pregnancy 11/09/2017  . Advanced maternal age in multigravida 04/20/2017  . Encounter for supervision of pregnancy resulting from assisted reproductive technology 04/20/2017  . Family history of cancer 07/02/2015    Plan  1. Admit to L&D :  2. EFM: -- Category 1 3. Admission labs    [name], M.D. 11/09/2017 7:42 AM

## 2017-11-09 NOTE — Anesthesia Procedure Notes (Signed)
Date/Time: 11/09/2017 7:50 AM Performed by: Ginger Carne, CRNA Pre-anesthesia Checklist: Patient identified, Emergency Drugs available, Suction available, Patient being monitored and Timeout performed Patient Re-evaluated:Patient Re-evaluated prior to induction Oxygen Delivery Method: Circle system utilized Preoxygenation: Pre-oxygenation with 100% oxygen

## 2017-11-09 NOTE — Anesthesia Post-op Follow-up Note (Signed)
Anesthesia QCDR form completed.        

## 2017-11-09 NOTE — Lactation Note (Signed)
This note was copied from a baby's chart. Lactation Consultation Note  Patient Name: Debbie Harding Today's Date: 11/09/2017     Maternal Data Formula Feeding for Exclusion: Yes Reason for exclusion: Mother's choice to formula feed on admision  Feeding Feeding Type: Bottle Fed - Formula Nipple Type: Regular  LATCH Score                   Interventions    Lactation Tools Discussed/Used     Consult Status   LC, RN, and OB discussed breastfeeding benefits with family during recovery. Parents say they will reach out if they desire to attempt breastfeeding in hospital setting, but have chosen to formula feed as of now.    Burnadette Peter 11/09/2017, 10:19 AM

## 2017-11-09 NOTE — Transfer of Care (Signed)
Immediate Anesthesia Transfer of Care Note  Patient: Debbie Harding  Procedure(s) Performed: PRIMARY CESAREAN SECTION (N/A )  Patient Location: PACU  Anesthesia Type:spinal  Level of Consciousness: awake, alert  and oriented  Airway & Oxygen Therapy: Patient Spontanous Breathing and Patient connected to nasal cannula oxygen  Post-op Assessment: Report given to RN and Post -op Vital signs reviewed and stable  Post vital signs: Reviewed and stable  Last Vitals:  Vitals Value Taken Time  BP 156/134 11/09/2017  9:08 AM  Temp 36.8 C 11/09/2017  9:08 AM  Pulse 63 11/09/2017  9:08 AM  Resp 17 11/09/2017  9:08 AM  SpO2 100 % 11/09/2017  9:08 AM    Last Pain:  Vitals:   11/09/17 0908  TempSrc: Temporal         Complications: No apparent anesthesia complications

## 2017-11-09 NOTE — Op Note (Signed)
OP NOTE  Date: 11/09/2017   8:54 AM Name Debbie Harding MR# [id number]  Preoperative Diagnosis: 1. Intrauterine pregnancy at [redacted]w[redacted]d Active Problems:   Term pregnancy  2.  Elective - H/O advanced reproductive technology  Postoperative Diagnosis: 1. Intrauterine pregnancy at [redacted]w[redacted]d, delivered 2. Viable infant 3. Remainder same as pre-op  4. Bladder repair   Procedure: 1. Primary Low-Transverse Cesarean Section  Surgeon: [name], MD  Assistant:  Dr. Valentino Saxon No other capable assistant available for this surgery which requires an experienced, high level assistant.  Anesthesia: Spinal   EBL: 500 ml    Findings: 1) female infant, Apgar scores of 9    at 1 minute and 9    at 5 minutes and a birthweight of 137.92  ounces.    2) Normal uterus, tubes and ovaries.   Procedure:  The patient was prepped and draped in the supine position and placed under spinal anesthesia.  A transverse incision was made across the abdomen in a Pfannenstiel manner. If indicated the old scar was systematically removed with sharp dissection.  We carried the dissection down to the level of the fascia.  The fascia was incised in a curvilinear manner.  The fascia was then elevated from the rectus muscles with blunt and sharp dissection.  The rectus muscles were separated laterally exposing the peritoneum.  The peritoneum was carefully entered.  While extending the peritoneal incision toward the pelvis, multiple thin layers were incised.  The bladder was found to be higher than expected and a 2.5cm incision was made into the dome of the bladder.  This was immediately noted and the extent of the incision identified using hemostats. We proceeded with the delivery.  A self-retaining retractor was placed.  The visceral peritoneum was incised in a curvilinear fashion across the lower uterine segment creating a bladder flap. A transverse incision was made across the lower uterine segment and extended laterally and  superiorly using the bandage scissors.  Artificial rupture membranes was performed and Clear fluid was noted.  The infant was delivered from the cephalic position.  A nuchal cord was not present. After an appropriate time interval, the cord was doubly clamped and cut. Cord blood was obtained if required.  The infant was handed to the pediatric personnel  who then placed the infant under heat lamps where it was cleaned dried and suctioned as needed. The placenta was delivered. The hysterotomy incision was then identified on ring forceps.  The uterine cavity was cleaned with a moist lap sponge.  The hysterotomy incision was closed with a running interlocking suture of Vicryl.  Hemostasis was excellent.  Pitocin was run in the IV and the uterus was found to be firm. The posterior cul-de-sac and gutters were cleaned and inspected.  Hemostasis was noted.   We turned our attention to the bladder.  With inspection it was noted that the incision was distant from the trigone. Using 4-0 Vicryl the mucosa was imbricated and closed with no through and through sutures.  A second layer was then closed with 3-0 Vicryl imbricating the first.  A third layer was then used to imbricate the first 2 layers. Hemostasis was noted. The fascia was then closed with a running suture of #1 Vicryl.  Hemostasis of the subcutaneous tissues was obtained using the Bovie.  The subcutaneous tissues were closed with a running suture of 000 Vicryl.  A subcuticular suture was placed.  Steri-Strips were applied in the usual manner.  A pressure dressing was placed.  The patient went to the recovery room in stable condition.   [name], M.D. 11/09/2017 8:54 AM

## 2017-11-09 NOTE — Anesthesia Preprocedure Evaluation (Addendum)
Anesthesia Evaluation  Patient identified by MRN, date of birth, ID band Patient awake    Reviewed: Allergy & Precautions, NPO status , Patient's Chart, lab work & pertinent test results  History of Anesthesia Complications Negative for: history of anesthetic complications  Airway Mallampati: II       Dental   Pulmonary neg sleep apnea, neg COPD,           Cardiovascular (-) hypertension(-) Past MI and (-) CHF (-) dysrhythmias (-) Valvular Problems/Murmurs     Neuro/Psych neg Seizures    GI/Hepatic Neg liver ROS, GERD (with pregnancy)  Medicated,  Endo/Other  neg diabetes  Renal/GU negative Renal ROS     Musculoskeletal   Abdominal   Peds  Hematology   Anesthesia Other Findings   Reproductive/Obstetrics                             Anesthesia Physical Anesthesia Plan  ASA: II  Anesthesia Plan: Spinal   Post-op Pain Management:    Induction:   PONV Risk Score and Plan:   Airway Management Planned:   Additional Equipment:   Intra-op Plan:   Post-operative Plan:   Informed Consent: I have reviewed the patients History and Physical, chart, labs and discussed the procedure including the risks, benefits and alternatives for the proposed anesthesia with the patient or authorized representative who has indicated his/her understanding and acceptance.     Plan Discussed with:   Anesthesia Plan Comments:         Anesthesia Quick Evaluation

## 2017-11-10 LAB — TYPE AND SCREEN
ABO/RH(D): A POS
Antibody Screen: NEGATIVE

## 2017-11-10 MED ORDER — OXYCODONE-ACETAMINOPHEN 5-325 MG PO TABS
0.5000 | ORAL_TABLET | ORAL | Status: DC | PRN
  Administered 2017-11-10 (×2): 0.5 via ORAL
  Administered 2017-11-11: 1 via ORAL
  Filled 2017-11-10 (×3): qty 1

## 2017-11-10 NOTE — Progress Notes (Signed)
Progress Note - Cesarean Delivery  Debbie Harding is a 37 y.o. G2P1011 now PP day 1 s/p C-Section, Low Vertical .   Subjective:  Patient reports no problems with eating, bowel movements, voiding, or their wound -pain controlled    Objective:  Vital signs in last 24 hours: Temp:  [98 F (36.7 C)-98.3 F (36.8 C)] 98 F (36.7 C) (11/05 0726) Pulse Rate:  [59-72] 59 (11/05 0726) Resp:  [16-18] 17 (11/05 0726) BP: (96-113)/(50-68) 113/64 (11/05 0726) SpO2:  [95 %-99 %] 97 % (11/05 0726)  Physical Exam:  General: alert, cooperative and no distress Lochia: appropriate Uterine Fundus: firm Incision: Dressing intact DVT Evaluation: No evidence of DVT seen on physical exam. Urine clear yellow -good output    Data Review No results for input(s): HGB, HCT in the last 72 hours.  Assessment:  Active Problems:   Term pregnancy   Status post Cesarean section. Doing well postoperatively.     Plan:       Continue current care.  Maintain Foley catheter.  I have discussed the surgery again in detail with the patient.  Necessity for continued catheterization and follow-up regimen discussed in detail.  Use of a leg bag discussed.  All questions answered.  Patient out of bed today, p.o. pain meds as needed, increase oral intake, learn care of Foley catheter leg bag and Foley bag.  Debbie Harding, M.D. 11/10/2017 12:10 PM

## 2017-11-10 NOTE — Progress Notes (Signed)
Pt requesting to take 0.5 tablet of percocet. Dr. Logan Bores called and verbal order to modify order to 0.5-1 tablet.

## 2017-11-10 NOTE — Anesthesia Post-op Follow-up Note (Signed)
  Anesthesia Pain Follow-up Note  Patient: Debbie Harding  Day #: 1  Date of Follow-up: 11/10/2017 Time: 7:33 AM  Last Vitals:  Vitals:   11/09/17 2317 11/10/17 0726  BP: (!) 99/50 113/64  Pulse: 62 (!) 59  Resp: 18 17  Temp: 36.8 C 36.7 C  SpO2: 96% 97%    Level of Consciousness: alert  Pain: mild   Side Effects:None  Catheter Site Exam:clean, dry     Plan: D/C from anesthesia care at surgeon's request  Clydene Pugh

## 2017-11-10 NOTE — Anesthesia Postprocedure Evaluation (Signed)
Anesthesia Post Note  Patient: RANEE PEASLEY  Procedure(s) Performed: PRIMARY CESAREAN SECTION (N/A )  Patient location during evaluation: Mother Baby Anesthesia Type: Spinal Level of consciousness: awake and alert and oriented Pain management: pain level controlled Vital Signs Assessment: post-procedure vital signs reviewed and stable Respiratory status: respiratory function stable Cardiovascular status: blood pressure returned to baseline Postop Assessment: no headache, no backache, spinal receding, patient able to bend at knees, no apparent nausea or vomiting, adequate PO intake and able to ambulate Anesthetic complications: no     Last Vitals:  Vitals:   11/09/17 2317 11/10/17 0726  BP: (!) 99/50 113/64  Pulse: 62 (!) 59  Resp: 18 17  Temp: 36.8 C 36.7 C  SpO2: 96% 97%    Last Pain:  Vitals:   11/10/17 0726  TempSrc: Oral  PainSc:                  Clydene Pugh

## 2017-11-10 NOTE — Progress Notes (Signed)
Patient educated on foley catheter care and given handout. Switched catheter to leg bag drainage system using teach back method with patient and patient's mother. Patient demonstrated understanding of how to care for foley catheter at home. Encouraged patient to ask staff for assistance while in the hospital but to become more independent in taking care of catheter.

## 2017-11-10 NOTE — Plan of Care (Signed)
Vs stable; up with assistance; foley catheter to remain in place; taking tylenol and motrin for pain control except for 0600 pt would prefer to have the IV toradol; encourage ambulation outside of room today (not just moving in bed or sitting on edge of bed)

## 2017-11-11 NOTE — Progress Notes (Addendum)
Postpartum Day # 2: Cesarean Delivery, with cystotomy repair  Subjective: Patient denies nausea, vomiting, incisional pain. She is tolerating PO, ambulating well, and + flatus.    Objective: Vitals:   11/10/17 0726 11/10/17 1502 11/10/17 2322 11/11/17 0740  BP: 113/64 106/69 (!) 103/48 99/62  Pulse: (!) 59 (!) 55 63 78  Resp: 17 18 18 18   Temp: 98 F (36.7 C) 98.2 F (36.8 C) 98.2 F (36.8 C) 98 F (36.7 C)  TempSrc: Oral Oral Oral Oral  SpO2: 97% 98% 98% 98%  Weight:      Height:         Physical Exam:  General: alert and no distress Lungs: clear to auscultation bilaterally Breasts: normal appearance, no masses or tenderness. Mild engorgement of right breast.  Heart: regular rate and rhythm, S1, S2 normal, no murmur, click, rub or gallop Abdomen: soft, non-tender; bowel sounds normal; no masses,  no organomegaly Pelvis: Lochia appropriate.  Urinary catheter in place draining clear urine. Uterine Fundus firm, Incision: healing well, no significant drainage, no dehiscence, no significant erythema Extremities: DVT Evaluation: No evidence of DVT seen on physical exam. Negative Homan's sign. No cords or calf tenderness. No significant calf/ankle edema.    Assessment/Plan: Status post Cesarean section with cystotomy repair. Doing well postoperatively.  Contraception OCPs Continue regular diet Continue PO pain management Maintain foley catheter for total of 10 days. Has been receiving instructions on catheter care for preparation for discharge. Continue prophylactic Macrobid.  Encourage ambulation Patient currently bottle feeding (notes she is afraid to breastfeed while on antibiotic).  Informed her that the current antibiotic was safe for breastfeeding but still desires to wait until after the catheter is removed.  Discussed that with engorgement, if she is planning on breastfeeding later she should begin breast pumping now, and can dispose of milk as desired.  Plan for  discharge tomorrow   Hildred Laser, MD [facility]

## 2017-11-11 NOTE — Progress Notes (Signed)
Assisted patient to bathroom. Ambulated to bathroom without difficulty. Voices concern regarding "pulling catheter out." Reassurance provided. Reviewed education regarding foley care, how to empty bag, cleanse perineal area and foley, and switch to leg bag. Pt demonstrated understanding. Teach back method utilized. Encouraged independence.

## 2017-11-11 NOTE — Plan of Care (Signed)
Vs stable; ambulates well in the hallway; taking motrin and percocet for pain control; writing down the baby's feeds and diaper changes very well; will be discharged with foley catheter in place

## 2017-11-12 MED ORDER — NITROFURANTOIN MONOHYD MACRO 100 MG PO CAPS: 100.0000 mg | ORAL_CAPSULE | Freq: Every day | ORAL | 0 refills | Status: DC

## 2017-11-12 NOTE — Discharge Summary (Signed)
Physician Obstetric Discharge Summary  Patient ID: Debbie Harding MRN: [id number] DOB/AGE: 35-Nov-1982 35 y.o.   Date of Admission: 11/09/2017  Date of Discharge: 11/12/2017  Admitting Diagnosis: Scheduled cesarean section at [redacted]w[redacted]d  Mode of Delivery: primary cesarean section       low uterine, transverse     Discharge Diagnosis: No other diagnosis   Intrapartum Procedures:    Post partum procedures:   Complications: cystotomy                        Discharge Day SOAP Note:  Subjective:  The patient has no complaints.  She is ambulating well. She is taking PO well. Pain is well controlled with current medications. Patient has been well instructed in Foley care and use of leg bag and general Foley bag.  She is comfortable with this instruction. She is passing flatus.    Objective  Vital signs in last 24 hours: BP (!) 123/56 (BP Location: Right Arm)   Pulse (!) 59   Temp 97.8 F (36.6 C) (Oral)   Resp 16   Ht 5' 8.5" (1.74 m)   Wt 73 kg   LMP 02/04/2017   SpO2 98%   Breastfeeding? Unknown   BMI 24.12 kg/m   Physical Exam: Gen: NAD Abdomen:  clean, dry, healing Fundus Fundal Tone: Firm  Lochia Amount: Scant     Data Review Labs: CBC Latest Ref Rng & Units 08/19/2017 04/10/2017 07/01/2016  WBC 3.4 - 10.8 x10E3/uL 6.4 7.5 5.6  Hemoglobin 11.1 - 15.9 g/dL 78.2 95.6 21.3  Hematocrit 34.0 - 46.6 % 32.7(L) 40.0 41.2  Platelets 150 - 450 x10E3/uL 196 227 228   A POS  Assessment:  Active Problems:   Term pregnancy   Doing well.  Normal progress as expected.     Plan:  Discharge to home  Modified rest as directed - may slowly resume normal activities with restrictions  as discussed.  Medications as written.  Maintain Foley catheter as directed.  Keep bladder decompressed.        Discharge Instructions: Per After Visit Summary. Activity: Advance as tolerated. Pelvic rest for 6 weeks.  Also refer to After Visit Summary.  Wound care discussed. Diet:  Regular Medications: Allergies as of 11/12/2017      Reactions   Latex Rash   Irritates skin severly   Penicillins Shortness Of Breath, Rash   Has patient had a PCN reaction causing immediate rash, facial/tongue/throat swelling, SOB or lightheadedness with hypotension: Yes Has patient had a PCN reaction causing severe rash involving mucus membranes or skin necrosis: No Has patient had a PCN reaction that required hospitalization: No Has patient had a PCN reaction occurring within the last 10 years: No If all of the above answers are "NO", then may proceed with Cephalosporin use.   Codeine Nausea Only, Rash   Sulfur Rash      Medication List    STOP taking these medications   aspirin EC 81 MG tablet   cetirizine 10 MG tablet Commonly known as:  ZYRTEC     TAKE these medications   multivitamin-prenatal 27-0.8 MG Tabs tablet Take 1 tablet by mouth daily at 12 noon.   nitrofurantoin (macrocrystal-monohydrate) 100 MG capsule Commonly known as:  MACROBID Take 1 capsule (100 mg total) by mouth at bedtime.      Outpatient follow up:  Follow-up Information    Linzie Collin, MD Follow up in 1 week(s).   Specialty:  Obstetrics and Gynecology Contact information: [address] [place] [address] [phone]          Postpartum contraception: Will discuss at first post-partum visit.  Discharged Condition: good  Discharged to: home  Newborn Data: Disposition:home with mother  Apgars: APGAR (1 MIN): 9   APGAR (5 MINS): 9   APGAR (10 MINS):    Baby Feeding: Bottle  [name], M.D. 11/12/2017 8:49 AM

## 2017-11-12 NOTE — Progress Notes (Signed)
Provided and reviewed discharge paperwork and prescriptions. Patient verbalized understanding, teach back method was used. Follow up appointment provided. Much instruction was provided regarding care of foley catheter. Teach back method was utilized regarding proper hygiene and cleansing of foley, changing out the bags, assuring tubing is free of obstruction, how to clamp the tubing one hour prior to appointment on the day of follow up appointment (Thursday, November 14). Patient discharged home with foley catheter to drain with significant other to transport. Taken to visitor entrance by hospital volunteer via wheel chair.

## 2017-11-13 ENCOUNTER — Telehealth: Payer: Self-pay

## 2017-11-13 NOTE — Telephone Encounter (Signed)
Pt called stating that both of her legs were really swollen. Pt stated that she has elevated both her legs all day and they are still swollen. Pt stated that she wanted to see [facility] but was informed that since DJE done her c-section she needed to see a doctor not a midwife. Pt was upset due to DJE nicking her bladder during the c-section and she thinks that her swollen legs are coming from issues with her bladder. Pt was advised that if the swelling increased to go to ED to be seen or call the nurse line. Pt was given the number to the nurse line and stated that she would do those things if the swelling increased.

## 2017-11-19 ENCOUNTER — Ambulatory Visit (INDEPENDENT_AMBULATORY_CARE_PROVIDER_SITE_OTHER): Payer: 59 | Admitting: Obstetrics and Gynecology

## 2017-11-19 ENCOUNTER — Encounter: Payer: Self-pay | Admitting: Obstetrics and Gynecology

## 2017-11-19 VITALS — BP 112/79 | HR 108 | Ht 68.5 in | Wt 138.0 lb

## 2017-11-19 DIAGNOSIS — Z9889 Other specified postprocedural states: Secondary | ICD-10-CM

## 2017-11-19 NOTE — Progress Notes (Signed)
Chronic HPI:      [name] is a 37 y.o. G2P1011 who LMP was No LMP recorded.  Subjective:   She presents today 10 days from cesarean delivery with cystotomy.  She has been maintaining her Foley catheter for the last 10 days at home.  She reports no problems.  She says she is no longer having pain from her cesarean delivery and that her swelling has gone down.  She has no issues with bowel movements and eating or any significant vaginal bleeding.    Hx: The following portions of the patient's history were reviewed and updated as appropriate:             She  has a past medical history of Medical history non-contributory. She does not have any pertinent problems on file. She  has a past surgical history that includes Venous thrombectomy (Left); Skin tag removal (2018); Wisdom tooth extraction (1998); and Cesarean section (N/A, 11/09/2017). Her family history includes Breast cancer (age of onset: 51) in her other; Breast cancer (age of onset: 50) in her maternal grandmother; Ovarian cancer (age of onset: 11) in her maternal grandmother. She  reports that she has never smoked. She has never used smokeless tobacco. She reports that she drank alcohol. She reports that she does not use drugs. She has a current medication list which includes the following prescription(s): nitrofurantoin (macrocrystal-monohydrate) and multivitamin-prenatal. She is allergic to latex; penicillins; codeine; and sulfur.       Review of Systems:  Review of Systems  Constitutional: Denied constitutional symptoms, night sweats, recent illness, fatigue, fever, insomnia and weight loss.  Eyes: Denied eye symptoms, eye pain, photophobia, vision change and visual disturbance.  Ears/Nose/Throat/Neck: Denied ear, nose, throat or neck symptoms, hearing loss, nasal discharge, sinus congestion and sore throat.  Cardiovascular: Denied cardiovascular symptoms, arrhythmia, chest pain/pressure, edema, exercise intolerance,  orthopnea and palpitations.  Respiratory: Denied pulmonary symptoms, asthma, pleuritic pain, productive sputum, cough, dyspnea and wheezing.  Gastrointestinal: Denied, gastro-esophageal reflux, melena, nausea and vomiting.  Genitourinary: Denied genitourinary symptoms including symptomatic vaginal discharge, pelvic relaxation issues, and urinary complaints.  Musculoskeletal: Denied musculoskeletal symptoms, stiffness, swelling, muscle weakness and myalgia.  Dermatologic: Denied dermatology symptoms, rash and scar.  Neurologic: Denied neurology symptoms, dizziness, headache, neck pain and syncope.  Psychiatric: Denied psychiatric symptoms, anxiety and depression.  Endocrine: Denied endocrine symptoms including hot flashes and night sweats.   Meds:   Current Outpatient Medications on File Prior to Visit  Medication Sig Dispense Refill  . nitrofurantoin, macrocrystal-monohydrate, (MACROBID) 100 MG capsule Take 1 capsule (100 mg total) by mouth at bedtime. 10 capsule 0  . Prenatal Vit-Fe Fumarate-FA (MULTIVITAMIN-PRENATAL) 27-0.8 MG TABS tablet Take 1 tablet by mouth daily at 12 noon.     No current facility-administered medications on file prior to visit.     Objective:     Vitals:   11/19/17 0900  BP: 112/79  Pulse: (!) 108              Foley catheter easily removed without problem.  Patient then voided approximately 120 cc without problem.  Assessment:    G2P1011 Patient Active Problem List   Diagnosis Date Noted  . Term pregnancy 11/09/2017  . Advanced maternal age in multigravida 04/20/2017  . Encounter for supervision of pregnancy resulting from assisted reproductive technology 04/20/2017  . Family history of cancer 07/02/2015     1. Post-operative state     Patient with excellent recovery postop.  Foley catheter now removed  and patient voiding without difficulty.   Plan:            1.  We have had a long discussion and I have stressed the importance of keeping her  bladder empty.  I have discussed a schedule with her so that she voids on schedule and not only when she has the urge.  She understands the necessity of this.  Patient to call immediately if she has any difficulty voiding or any problems with the inability to void when she has the urge. Orders No orders of the defined types were placed in this encounter.   No orders of the defined types were placed in this encounter.     F/U  Return in about 4 weeks (around 12/17/2017).  Elonda Huskyavid J. Vickii Volland, M.D. 11/19/2017 9:50 AM

## 2017-11-19 NOTE — Progress Notes (Signed)
Pt presents today for 1 week postpartum incision check and catheter removal. Pt states she is feeling well with only light bleeding and mild soreness.

## 2017-11-24 ENCOUNTER — Telehealth: Payer: Self-pay

## 2017-11-24 NOTE — Telephone Encounter (Signed)
Per DJE, called pt to see how she was feeling after her c-section and catheter removal. Pt stated she is feeling much better and urinating without issue. Pt was very pleased that we called to check on her.

## 2017-12-09 ENCOUNTER — Encounter: Payer: Self-pay | Admitting: Obstetrics and Gynecology

## 2017-12-09 ENCOUNTER — Ambulatory Visit (INDEPENDENT_AMBULATORY_CARE_PROVIDER_SITE_OTHER): Payer: 59 | Admitting: Obstetrics and Gynecology

## 2017-12-09 VITALS — BP 103/69 | HR 78 | Ht 68.5 in | Wt 138.5 lb

## 2017-12-09 DIAGNOSIS — Z9889 Other specified postprocedural states: Secondary | ICD-10-CM

## 2017-12-09 NOTE — Progress Notes (Signed)
Patient here for incision check.  Patient c/o constant lower right pain above incision site.

## 2017-12-09 NOTE — Progress Notes (Signed)
HPI:      [name] is a 37 y.o. G2P1011 who LMP was No LMP recorded.  Subjective:   She presents today approximate 4 weeks from cesarean delivery with cystotomy.  She reports she is doing well she is voiding without any difficulty.  She says that her pain is generally gone with the exception of small area on her right side approximately 8 cm above her incision.  She states her bleeding is resolved except for some occasional dark spotting. She does not desire birth control at this time.    Hx: The following portions of the patient's history were reviewed and updated as appropriate:             She  has a past medical history of Medical history non-contributory. She does not have any pertinent problems on file. She  has a past surgical history that includes Venous thrombectomy (Left); Skin tag removal (2018); Wisdom tooth extraction (1998); and Cesarean section (N/A, 11/09/2017). Her family history includes Breast cancer (age of onset: 5035) in her other; Breast cancer (age of onset: 7480) in her maternal grandmother; Ovarian cancer (age of onset: 5142) in her maternal grandmother. She  reports that she has never smoked. She has never used smokeless tobacco. She reports that she drank alcohol. She reports that she does not use drugs. She has a current medication list which includes the following prescription(s): cetirizine and multivitamin-prenatal. She is allergic to latex; penicillins; codeine; and sulfur.       Review of Systems:  Review of Systems  Constitutional: Denied constitutional symptoms, night sweats, recent illness, fatigue, fever, insomnia and weight loss.  Eyes: Denied eye symptoms, eye pain, photophobia, vision change and visual disturbance.  Ears/Nose/Throat/Neck: Denied ear, nose, throat or neck symptoms, hearing loss, nasal discharge, sinus congestion and sore throat.  Cardiovascular: Denied cardiovascular symptoms, arrhythmia, chest pain/pressure, edema, exercise  intolerance, orthopnea and palpitations.  Respiratory: Denied pulmonary symptoms, asthma, pleuritic pain, productive sputum, cough, dyspnea and wheezing.  Gastrointestinal: Denied, gastro-esophageal reflux, melena, nausea and vomiting.  Genitourinary: Denied genitourinary symptoms including symptomatic vaginal discharge, pelvic relaxation issues, and urinary complaints.  Musculoskeletal: Denied musculoskeletal symptoms, stiffness, swelling, muscle weakness and myalgia.  Dermatologic: Denied dermatology symptoms, rash and scar.  Neurologic: Denied neurology symptoms, dizziness, headache, neck pain and syncope.  Psychiatric: Denied psychiatric symptoms, anxiety and depression.  Endocrine: Denied endocrine symptoms including hot flashes and night sweats.   Meds:   Current Outpatient Medications on File Prior to Visit  Medication Sig Dispense Refill  . cetirizine (ZYRTEC) 10 MG chewable tablet Chew 10 mg by mouth as needed for allergies.    . Prenatal Vit-Fe Fumarate-FA (MULTIVITAMIN-PRENATAL) 27-0.8 MG TABS tablet Take 1 tablet by mouth daily at 12 noon.     No current facility-administered medications on file prior to visit.     Objective:     Vitals:   12/09/17 1100  BP: 103/69  Pulse: 78               Abdomen: Soft.  Non-tender.  No masses.  No HSM.  Incision/s: Intact.  Healing well.  No erythema.  No drainage.  There is some pain in the abdominal wall approximately 8 cm above the right aspect of her incision.  Question fascial area.  This seems to correspond with her occasional abdominal pain.    Assessment:    G2P1011 Patient Active Problem List   Diagnosis Date Noted  . Term pregnancy 11/09/2017  . Advanced maternal age  in multigravida 04/20/2017  . Encounter for supervision of pregnancy resulting from assisted reproductive technology 04/20/2017  . Family history of cancer 07/02/2015     1. Post-operative state   2. Postpartum care and examination immediately after  delivery     Patient doing well postop and postpartum.  She is bottlefeeding.  She is having no further issues with voiding.   Plan:            1.  Expectant management of abdominal pain.  Patient may resume normal activities with the exception of heavy lifting. Orders No orders of the defined types were placed in this encounter.   No orders of the defined types were placed in this encounter.     F/U  Return in about 3 months (around 03/10/2018).  [name], M.D. 12/09/2017 11:18 AM

## 2018-01-01 ENCOUNTER — Telehealth: Payer: Self-pay | Admitting: Obstetrics and Gynecology

## 2018-01-01 NOTE — Telephone Encounter (Signed)
Patient stated that the company is stating that they did not receive the paperwork. She stated that she knows we have faxed the forms and that I had called her to let her know. She has been having issues with the company the entire time [facility] I told her that I would refax them,

## 2018-01-01 NOTE — Telephone Encounter (Signed)
The patient called and stated that her paperwork was to be faxed on Monday of this past week and her company is stating that they have not received her paperwork. The patient would like top speak with a nurse if possible to see if the paperwork can be re-faxed. Please advise.

## 2018-01-08 ENCOUNTER — Telehealth: Payer: Self-pay | Admitting: Obstetrics and Gynecology

## 2018-01-08 NOTE — Telephone Encounter (Signed)
Spoke with patient and she stated that Debbie Harding will be contacting the office some time today. She stated that the office has two different dates down. The original paperwork had the patient going back 02/25/2018 and the other paperwork has her going back 01/09/2018. I will speak with Debbie Harding about this.

## 2018-01-08 NOTE — Telephone Encounter (Signed)
Per Darol Destine called [facility] to correct the dates the dates on the Attending Physician Statement. The dates should have been on the form per Crystal should have been 6-8 weeks after 11/05/17. I have called the facility and they have requested that we cross through the date and put the corrected date on the form to refax. I am sending this message to Crystal and Dr. Logan Bores.

## 2018-01-08 NOTE — Telephone Encounter (Signed)
Patient states HR had not received fax; called HR back again and they did receive refax from [employer] on 12/23; copy of paperwork filled out twice by 2 diff people maybe??...one said return on 02/09/2018, and Kisha said thru 02/25/2018? The patient states she is very happy with the return date of Feb 25, 2018, but wants to be sure that is the date they go by?  Patient states she is great with 02/25/2018 but before she calls HR back today to verify that return date, she is asking for a call back and to give Bradly Bienenstock a big hug!  Please advise, thanks.

## 2018-01-08 NOTE — Telephone Encounter (Signed)
Spoke with patient to let her know that as of 01/09/2018 her lifting restrictions are up. I informed the patient that the one form would need to be changed to 6-8 weeks after 11/04/17. Patient verbalized understanding and thanked me for the clarification.

## 2018-02-22 ENCOUNTER — Telehealth: Payer: Self-pay

## 2018-02-22 NOTE — Telephone Encounter (Signed)
Pt called asking if she could copies of her co-payments for tax reason. Pt is requesting a call back. Please advise.

## 2018-02-22 NOTE — Telephone Encounter (Signed)
Done

## 2018-03-09 ENCOUNTER — Encounter: Payer: 59 | Admitting: Obstetrics and Gynecology

## 2018-03-31 ENCOUNTER — Telehealth: Payer: Self-pay

## 2018-03-31 NOTE — Telephone Encounter (Signed)
Pt called to reschedule her annual appt. No answer LM that I changed her annual exam to June 16, 2018 at 2pm. Informed pt that if she was not able to keep the appointment to please call and reschedule.

## 2018-04-14 ENCOUNTER — Encounter: Payer: Self-pay | Admitting: Obstetrics and Gynecology

## 2018-06-15 ENCOUNTER — Ambulatory Visit (INDEPENDENT_AMBULATORY_CARE_PROVIDER_SITE_OTHER): Payer: 59 | Admitting: Obstetrics and Gynecology

## 2018-06-15 ENCOUNTER — Other Ambulatory Visit (HOSPITAL_COMMUNITY)
Admission: RE | Admit: 2018-06-15 | Discharge: 2018-06-15 | Disposition: A | Discharge status: Home / Self Care | Payer: 59 | Source: Ambulatory Visit | Attending: Obstetrics and Gynecology | Admitting: Obstetrics and Gynecology

## 2018-06-15 ENCOUNTER — Other Ambulatory Visit: Payer: Self-pay

## 2018-06-15 ENCOUNTER — Encounter: Payer: Self-pay | Admitting: Obstetrics and Gynecology

## 2018-06-15 VITALS — BP 104/64 | HR 81 | Ht 68.5 in | Wt 137.8 lb

## 2018-06-15 DIAGNOSIS — Z01419 Encounter for gynecological examination (general) (routine) without abnormal findings: Secondary | ICD-10-CM | POA: Diagnosis present

## 2018-06-15 DIAGNOSIS — Z124 Encounter for screening for malignant neoplasm of cervix: Secondary | ICD-10-CM | POA: Insufficient documentation

## 2018-06-15 NOTE — Progress Notes (Signed)
Pt is present today for annual exam. Pt's last pap recorded was 06/27/18. Pt stated that she is doing well, denies any itching or burning in the vaginal area.

## 2018-06-15 NOTE — Patient Instructions (Addendum)

## 2018-06-15 NOTE — Progress Notes (Signed)
GYNECOLOGY ANNUAL PHYSICAL EXAM PROGRESS NOTE  Subjective:    Debbie Harding is a 38 y.o. G21P1011 female who presents for an annual exam. The patient has no complaints today. The patient is sexually active.  The patient wears seatbelts: yes. The patient participates in regular exercise: no. Has the patient ever been transfused or tattooed?: no. The patient reports that there is not domestic violence in her life.    Gynecologic History Menarche age: 30 Patient's last menstrual period was 06/05/2018. Contraception: none. Patient has a h/o infertility requiring IVF.  History of STI's: Denies Last Pap: 06/27/2015. Results were: normal.  Denies h/o abnormal pap smears.    OB History  Gravida Para Term Preterm AB Living  2 1 1  0 1 1  SAB TAB Ectopic Multiple Live Births  1 0 0 0 1    # Outcome Date GA Lbr Len/2nd Weight Sex Delivery Anes PTL Lv  2 Term 11/09/17 [redacted]w[redacted]d  8 lb 9.9 oz (3.91 kg) F CS-LVertical Spinal  LIV     Name: [facility]     Apgar1: 9  Apgar5: 9  1 SAB 2018            Past Medical History:  Diagnosis Date  . Medical history non-contributory     Past Surgical History:  Procedure Laterality Date  . CESAREAN SECTION N/A 11/09/2017   Procedure: PRIMARY CESAREAN SECTION;  Surgeon: Harlin Heys, MD;  Location: [facility] ORS;  Service: Obstetrics;  Laterality: N/A;  . SKIN TAG REMOVAL  2018  . VENOUS THROMBECTOMY Left   . WISDOM TOOTH EXTRACTION  1998    Family History  Problem Relation Age of Onset  . Healthy Mother   . Healthy Father   . Breast cancer Maternal Grandmother 80  . Ovarian cancer Maternal Grandmother 42  . Breast cancer Other 35    Social History   Socioeconomic History  . Marital status: Single    Spouse name: Not on file  . Number of children: Not on file  . Years of education: Not on file  . Highest education level: Not on file  Occupational History  . Not on file  Social Needs  . Financial resource strain: Not on file  .  Food insecurity:    Worry: Not on file    Inability: Not on file  . Transportation needs:    Medical: Not on file    Non-medical: Not on file  Tobacco Use  . Smoking status: Never Smoker  . Smokeless tobacco: Never Used  Substance and Sexual Activity  . Alcohol use: Yes    Comment: rare  . Drug use: No  . Sexual activity: Yes    Birth control/protection: Coitus interruptus, None  Lifestyle  . Physical activity:    Days per week: Not on file    Minutes per session: Not on file  . Stress: Not on file  Relationships  . Social connections:    Talks on phone: Not on file    Gets together: Not on file    Attends religious service: Not on file    Active member of club or organization: Not on file    Attends meetings of clubs or organizations: Not on file    Relationship status: Not on file  . Intimate partner violence:    Fear of current or ex partner: Not on file    Emotionally abused: Not on file    Physically abused: Not on file    Forced  sexual activity: Not on file  Other Topics Concern  . Not on file  Social History Narrative  . Not on file    Current Outpatient Medications on File Prior to Visit  Medication Sig Dispense Refill  . cetirizine (ZYRTEC) 10 MG chewable tablet Chew 10 mg by mouth as needed for allergies.     No current facility-administered medications on file prior to visit.     Allergies  Allergen Reactions  . Latex Rash    Irritates skin severly  . Penicillins Shortness Of Breath and Rash    Has patient had a PCN reaction causing immediate rash, facial/tongue/throat swelling, SOB or lightheadedness with hypotension: Yes Has patient had a PCN reaction causing severe rash involving mucus membranes or skin necrosis: No Has patient had a PCN reaction that required hospitalization: No Has patient had a PCN reaction occurring within the last 10 years: No If all of the above answers are "NO", then may proceed with Cephalosporin use.   . Codeine Nausea  Only and Rash  . Sulfur Rash      Review of Systems Constitutional: negative for chills, fatigue, fevers and sweats Eyes: negative for irritation, redness and visual disturbance Ears, nose, mouth, throat, and face: negative for hearing loss, nasal congestion, snoring and tinnitus Respiratory: negative for asthma, cough, sputum Cardiovascular: negative for chest pain, dyspnea, exertional chest pressure/discomfort, irregular heart beat, palpitations and syncope Gastrointestinal: negative for abdominal pain, change in bowel habits, nausea and vomiting Genitourinary: negative for abnormal menstrual periods, genital lesions, sexual problems and vaginal discharge, dysuria and urinary incontinence Integument/breast: negative for breast lump, breast tenderness and nipple discharge Hematologic/lymphatic: negative for bleeding and easy bruising Musculoskeletal:negative for back pain and muscle weakness Neurological: negative for dizziness, headaches, vertigo and weakness Endocrine: negative for diabetic symptoms including polydipsia, polyuria and skin dryness Allergic/Immunologic: negative for hay fever and urticaria        Objective:  Blood pressure 104/64, pulse 81, height 5' 8.5" (1.74 m), weight 137 lb 12.8 oz (62.5 kg), last menstrual period 06/05/2018, not currently breastfeeding. Body mass index is 20.65 kg/m.    General Appearance:    Alert, cooperative, no distress, appears stated age  Head:    Normocephalic, without obvious abnormality, atraumatic  Eyes:    PERRL, conjunctiva/corneas clear, EOM's intact, both eyes  Ears:    Normal external ear canals, both ears  Nose:   Nares normal, septum midline, mucosa normal, no drainage or sinus tenderness  Throat:   Lips, mucosa, and tongue normal; teeth and gums normal  Neck:   Supple, symmetrical, trachea midline, no adenopathy; thyroid: no enlargement/tenderness/nodules; no carotid bruit or JVD  Back:     Symmetric, no curvature, ROM  normal, no CVA tenderness  Lungs:     Clear to auscultation bilaterally, respirations unlabored  Chest Wall:    No tenderness or deformity   Heart:    Regular rate and rhythm, S1 and S2 normal, no murmur, rub or gallop  Breast Exam:    No tenderness, masses, or nipple abnormality  Abdomen:     Soft, non-tender, bowel sounds active all four quadrants, no masses, no organomegaly.    Genitalia:    Pelvic:external genitalia normal, vagina without lesions, discharge, or tenderness, rectovaginal septum  normal. Cervix normal in appearance, no cervical motion tenderness, no adnexal masses or tenderness.  Uterus normal size, shape, mobile, regular contours, nontender.  Rectal:    Normal external sphincter.  No hemorrhoids appreciated. Internal exam not done.   Extremities:  Extremities normal, atraumatic, no cyanosis or edema  Pulses:   2+ and symmetric all extremities  Skin:   Skin color, texture, turgor normal, no rashes or lesions  Lymph nodes:   Cervical, supraclavicular, and axillary nodes normal  Neurologic:   CNII-XII intact, normal strength, sensation and reflexes throughout   .  Labs:  Lab Results  Component Value Date   WBC 6.4 08/19/2017   HGB 11.1 08/19/2017   HCT 32.7 (L) 08/19/2017   MCV 97 08/19/2017   PLT 196 08/19/2017    Lab Results  Component Value Date   CREATININE 0.67 07/01/2016   BUN 9 07/01/2016   NA 142 07/01/2016   K 4.5 07/01/2016   CL 105 07/01/2016   CO2 23 07/01/2016    Lab Results  Component Value Date   ALT 13 07/01/2016   AST 15 07/01/2016   ALKPHOS 40 07/01/2016   BILITOT 0.5 07/01/2016    No results found for: TSH   Assessment:   Healthy female exam.  Family history of cancer   Plan:    Blood tests: CBC with diff and Comprehensive metabolic panel. Breast self exam technique reviewed and patient encouraged to perform self-exam monthly. Contraception: None. Discussed healthy lifestyle modifications. Pap smear performed. Return to  clinic in 1 year.     Hildred Laserherry, Shanora Christensen, MD [facility]

## 2018-06-16 ENCOUNTER — Encounter: Payer: Self-pay | Admitting: Obstetrics and Gynecology

## 2018-06-16 LAB — COMPREHENSIVE METABOLIC PANEL
ALT: 15 IU/L (ref 0–32)
AST: 15 IU/L (ref 0–40)
Albumin/Globulin Ratio: 1.9 (ref 1.2–2.2)
Albumin: 4.1 g/dL (ref 3.8–4.8)
Alkaline Phosphatase: 55 IU/L (ref 39–117)
BUN/Creatinine Ratio: 11 (ref 9–23)
BUN: 8 mg/dL (ref 6–20)
Bilirubin Total: 0.3 mg/dL (ref 0.0–1.2)
CO2: 23 mmol/L (ref 20–29)
Calcium: 9.1 mg/dL (ref 8.7–10.2)
Chloride: 103 mmol/L (ref 96–106)
Creatinine, Ser: 0.71 mg/dL (ref 0.57–1.00)
GFR calc Af Amer: 128 mL/min/{1.73_m2} (ref >59)
GFR calc non Af Amer: 111 mL/min/{1.73_m2} (ref >59)
Globulin, Total: 2.2 g/dL (ref 1.5–4.5)
Glucose: 67 mg/dL (ref 65–99)
Potassium: 4.2 mmol/L (ref 3.5–5.2)
Sodium: 139 mmol/L (ref 134–144)
Total Protein: 6.3 g/dL (ref 6.0–8.5)

## 2018-06-16 LAB — CBC
Hematocrit: 41.2 % (ref 34.0–46.6)
Hemoglobin: 13.6 g/dL (ref 11.1–15.9)
MCH: 30.2 pg (ref 26.6–33.0)
MCHC: 33 g/dL (ref 31.5–35.7)
MCV: 92 fL (ref 79–97)
Platelets: 247 10*3/uL (ref 150–450)
RBC: 4.5 x10E6/uL (ref 3.77–5.28)
RDW: 12.2 % (ref 11.7–15.4)
WBC: 3.6 10*3/uL (ref 3.4–10.8)

## 2018-06-17 LAB — CYTOLOGY - PAP
Diagnosis: NEGATIVE
HPV: NOT DETECTED

## 2019-06-16 NOTE — Progress Notes (Signed)
Pt present for annual exam.  Pt noticed muscle spasms in the right side of her abd. Pt denies any other issues at this time.

## 2019-06-16 NOTE — Patient Instructions (Signed)
Preventive Care 21-37 Years Old, Female Preventive care refers to visits with your health care provider and lifestyle choices that can promote health and wellness. This includes:  A yearly physical exam. This may also be called an annual well check.  Regular dental visits and eye exams.  Immunizations.  Screening for certain conditions.  Healthy lifestyle choices, such as eating a healthy diet, getting regular exercise, not using drugs or products that contain nicotine and tobacco, and limiting alcohol use. What can I expect for my preventive care visit? Physical exam Your health care provider will check your:  Height and weight. This may be used to calculate body mass index (BMI), which tells if you are at a healthy weight.  Heart rate and blood pressure.  Skin for abnormal spots. Counseling Your health care provider may ask you questions about your:  Alcohol, tobacco, and drug use.  Emotional well-being.  Home and relationship well-being.  Sexual activity.  Eating habits.  Work and work environment.  Method of birth control.  Menstrual cycle.  Pregnancy history. What immunizations do I need?  Influenza (flu) vaccine  This is recommended every year. Tetanus, diphtheria, and pertussis (Tdap) vaccine  You may need a Td booster every 10 years. Varicella (chickenpox) vaccine  You may need this if you have not been vaccinated. Human papillomavirus (HPV) vaccine  If recommended by your health care provider, you may need three doses over 6 months. Measles, mumps, and rubella (MMR) vaccine  You may need at least one dose of MMR. You may also need a second dose. Meningococcal conjugate (MenACWY) vaccine  One dose is recommended if you are age 19-21 years and a first-year college student living in a residence hall, or if you have one of several medical conditions. You may also need additional booster doses. Pneumococcal conjugate (PCV13) vaccine  You may need  this if you have certain conditions and were not previously vaccinated. Pneumococcal polysaccharide (PPSV23) vaccine  You may need one or two doses if you smoke cigarettes or if you have certain conditions. Hepatitis A vaccine  You may need this if you have certain conditions or if you travel or work in places where you may be exposed to hepatitis A. Hepatitis B vaccine  You may need this if you have certain conditions or if you travel or work in places where you may be exposed to hepatitis B. Haemophilus influenzae type b (Hib) vaccine  You may need this if you have certain conditions. You may receive vaccines as individual doses or as more than one vaccine together in one shot (combination vaccines). Talk with your health care provider about the risks and benefits of combination vaccines. What tests do I need?  Blood tests  Lipid and cholesterol levels. These may be checked every 5 years starting at age 20.  Hepatitis C test.  Hepatitis B test. Screening  Diabetes screening. This is done by checking your blood sugar (glucose) after you have not eaten for a while (fasting).  Sexually transmitted disease (STD) testing.  BRCA-related cancer screening. This may be done if you have a family history of breast, ovarian, tubal, or peritoneal cancers.  Pelvic exam and Pap test. This may be done every 3 years starting at age 21. Starting at age 30, this may be done every 5 years if you have a Pap test in combination with an HPV test. Talk with your health care provider about your test results, treatment options, and if necessary, the need for more tests.   Follow these instructions at home: Eating and drinking   Eat a diet that includes fresh fruits and vegetables, whole grains, lean protein, and low-fat dairy.  Take vitamin and mineral supplements as recommended by your health care provider.  Do not drink alcohol if: ? Your health care provider tells you not to drink. ? You are  pregnant, may be pregnant, or are planning to become pregnant.  If you drink alcohol: ? Limit how much you have to 0-1 drink a day. ? Be aware of how much alcohol is in your drink. In the [place] one drink equals one 12 oz bottle of beer (355 mL), one 5 oz glass of wine (148 mL), or one 1 oz glass of hard liquor (44 mL). Lifestyle  Take daily care of your teeth and gums.  Stay active. Exercise for at least 30 minutes on 5 or more days each week.  Do not use any products that contain nicotine or tobacco, such as cigarettes, e-cigarettes, and chewing tobacco. If you need help quitting, ask your health care provider.  If you are sexually active, practice safe sex. Use a condom or other form of birth control (contraception) in order to prevent pregnancy and STIs (sexually transmitted infections). If you plan to become pregnant, see your health care provider for a preconception visit. What's next?  Visit your health care provider once a year for a well check visit.  Ask your health care provider how often you should have your eyes and teeth checked.  Stay up to date on all vaccines. This information is not intended to replace advice given to you by your health care provider. Make sure you discuss any questions you have with your health care provider. Document Revised: 09/03/2017 Document Reviewed: 09/03/2017 Elsevier Patient Education  2020 [employer] Breast Self-Awareness Breast self-awareness is knowing how your breasts look and feel. Doing breast self-awareness is important. It allows you to catch a breast problem early while it is still small and can be treated. All women should do breast self-awareness, including women who have had breast implants. Tell your doctor if you notice a change in your breasts. What you need:  A mirror.  A well-lit room. How to do a breast self-exam A breast self-exam is one way to learn what is normal for your breasts and to check for changes. To do a  breast self-exam: Look for changes  1. Take off all the clothes above your waist. 2. Stand in front of a mirror in a room with good lighting. 3. Put your hands on your hips. 4. Push your hands down. 5. Look at your breasts and nipples in the mirror to see if one breast or nipple looks different from the other. Check to see if: ? The shape of one breast is different. ? The size of one breast is different. ? There are wrinkles, dips, and bumps in one breast and not the other. 6. Look at each breast for changes in the skin, such as: ? Redness. ? Scaly areas. 7. Look for changes in your nipples, such as: ? Liquid around the nipples. ? Bleeding. ? Dimpling. ? Redness. ? A change in where the nipples are. Feel for changes  1. Lie on your back on the floor. 2. Feel each breast. To do this, follow these steps: ? Pick a breast to feel. ? Put the arm closest to that breast above your head. ? Use your other arm to feel the nipple area of your breast. Feel   the area with the pads of your three middle fingers by making small circles with your fingers. For the first circle, press lightly. For the second circle, press harder. For the third circle, press even harder. ? Keep making circles with your fingers at the different pressures as you move down your breast. Stop when you feel your ribs. ? Move your fingers a little toward the center of your body. ? Start making circles with your fingers again, this time going up until you reach your collarbone. ? Keep making up-and-down circles until you reach your armpit. Remember to keep using the three pressures. ? Feel the other breast in the same way. 3. Sit or stand in the tub or shower. 4. With soapy water on your skin, feel each breast the same way you did in step 2 when you were lying on the floor. Write down what you find Writing down what you find can help you remember what to tell your doctor. Write down:  What is normal for each breast.  Any  changes you find in each breast, including: ? The kind of changes you find. ? Whether you have pain. ? Size and location of any lumps.  When you last had your menstrual period. General tips  Check your breasts every month.  If you are breastfeeding, the best time to check your breasts is after you feed your baby or after you use a breast pump.  If you get menstrual periods, the best time to check your breasts is 5-7 days after your menstrual period is over.  With time, you will become comfortable with the self-exam, and you will begin to know if there are changes in your breasts. Contact a doctor if you:  See a change in the shape or size of your breasts or nipples.  See a change in the skin of your breast or nipples, such as red or scaly skin.  Have fluid coming from your nipples that is not normal.  Find a lump or thick area that was not there before.  Have pain in your breasts.  Have any concerns about your breast health. Summary  Breast self-awareness includes looking for changes in your breasts, as well as feeling for changes within your breasts.  Breast self-awareness should be done in front of a mirror in a well-lit room.  You should check your breasts every month. If you get menstrual periods, the best time to check your breasts is 5-7 days after your menstrual period is over.  Let your doctor know of any changes you see in your breasts, including changes in size, changes on the skin, pain or tenderness, or fluid from your nipples that is not normal. This information is not intended to replace advice given to you by your health care provider. Make sure you discuss any questions you have with your health care provider. Document Revised: 08/11/2017 Document Reviewed: 08/11/2017 Elsevier Patient Education  2020 [employer] Inc[employer]  

## 2019-06-17 ENCOUNTER — Encounter: Payer: Self-pay | Admitting: Obstetrics and Gynecology

## 2019-06-17 ENCOUNTER — Other Ambulatory Visit: Payer: Self-pay

## 2019-06-17 ENCOUNTER — Ambulatory Visit (INDEPENDENT_AMBULATORY_CARE_PROVIDER_SITE_OTHER): Payer: 59 | Admitting: Obstetrics and Gynecology

## 2019-06-17 VITALS — BP 84/55 | HR 76 | Ht 68.5 in | Wt 138.2 lb

## 2019-06-17 DIAGNOSIS — Z01419 Encounter for gynecological examination (general) (routine) without abnormal findings: Secondary | ICD-10-CM

## 2019-06-17 DIAGNOSIS — Z809 Family history of malignant neoplasm, unspecified: Secondary | ICD-10-CM | POA: Diagnosis not present

## 2019-06-17 NOTE — Progress Notes (Signed)
GYNECOLOGY ANNUAL PHYSICAL EXAM PROGRESS NOTE  Subjective:    Debbie Harding is a 39 y.o. G7P1011 female who presents for an annual exam. The patient has no complaints today. The patient is sexually active.  The patient wears seatbelts: yes. The patient participates in regular exercise: no. Has the patient ever been transfused or tattooed?: no. The patient reports that there is not domestic violence in her life.    Gynecologic History Menarche age: 60 Patient's last menstrual period was 06/02/2019. Contraception: none. Patient has a h/o infertility requiring IVF.  History of STI's: Denies Last Pap: 06/15/2018. Results were: normal.  Denies h/o abnormal pap smears.    OB History  Gravida Para Term Preterm AB Living  2 1 1  0 1 1  SAB TAB Ectopic Multiple Live Births  1 0 0 0 1    # Outcome Date GA Lbr Len/2nd Weight Sex Delivery Anes PTL Lv  2 Term 11/09/17 [redacted]w[redacted]d  8 lb 9.9 oz (3.91 kg) F CS-LVertical Spinal  LIV     Name: [facility]     Apgar1: 9  Apgar5: 9  1 SAB 2018            Past Medical History:  Diagnosis Date  . Medical history non-contributory     Past Surgical History:  Procedure Laterality Date  . CESAREAN SECTION N/A 11/09/2017   Procedure: PRIMARY CESAREAN SECTION;  Surgeon: Harlin Heys, MD;  Location: [facility] ORS;  Service: Obstetrics;  Laterality: N/A;  . SKIN TAG REMOVAL  2018  . VENOUS THROMBECTOMY Left   . WISDOM TOOTH EXTRACTION  1998    Family History  Problem Relation Age of Onset  . Healthy Mother   . Healthy Father   . Breast cancer Maternal Grandmother 80  . Ovarian cancer Maternal Grandmother 42  . Breast cancer Other 35    Social History   Socioeconomic History  . Marital status: Single    Spouse name: Not on file  . Number of children: Not on file  . Years of education: Not on file  . Highest education level: Not on file  Occupational History  . Not on file  Tobacco Use  . Smoking status: Never Smoker  . Smokeless  tobacco: Never Used  Vaping Use  . Vaping Use: Never used  Substance and Sexual Activity  . Alcohol use: Yes    Comment: rare  . Drug use: No  . Sexual activity: Yes    Birth control/protection: Coitus interruptus, None  Other Topics Concern  . Not on file  Social History Narrative  . Not on file   Social Determinants of Health   Financial Resource Strain:   . Difficulty of Paying Living Expenses:   Food Insecurity:   . Worried About Charity fundraiser in the Last Year:   . Arboriculturist in the Last Year:   Transportation Needs:   . Film/video editor (Medical):   Marland Kitchen Lack of Transportation (Non-Medical):   Physical Activity:   . Days of Exercise per Week:   . Minutes of Exercise per Session:   Stress:   . Feeling of Stress :   Social Connections:   . Frequency of Communication with Friends and Family:   . Frequency of Social Gatherings with Friends and Family:   . Attends Religious Services:   . Active Member of Clubs or Organizations:   . Attends Archivist Meetings:   Marland Kitchen Marital Status:   Intimate  Partner Violence:   . Fear of Current or Ex-Partner:   . Emotionally Abused:   Marland Kitchen Physically Abused:   . Sexually Abused:     Current Outpatient Medications on File Prior to Visit  Medication Sig Dispense Refill  . cetirizine (ZYRTEC) 10 MG chewable tablet Chew 10 mg by mouth as needed for allergies.     No current facility-administered medications on file prior to visit.    Allergies  Allergen Reactions  . Latex Rash    Irritates skin severly  . Penicillins Shortness Of Breath and Rash    Has patient had a PCN reaction causing immediate rash, facial/tongue/throat swelling, SOB or lightheadedness with hypotension: Yes Has patient had a PCN reaction causing severe rash involving mucus membranes or skin necrosis: No Has patient had a PCN reaction that required hospitalization: No Has patient had a PCN reaction occurring within the last 10 years: No If  all of the above answers are "NO", then may proceed with Cephalosporin use.   . Codeine Nausea Only and Rash  . Sulfur Rash      Review of Systems Constitutional: negative for chills, fatigue, fevers and sweats Eyes: negative for irritation, redness and visual disturbance Ears, nose, mouth, throat, and face: negative for hearing loss, nasal congestion, snoring and tinnitus Respiratory: negative for asthma, cough, sputum Cardiovascular: negative for chest pain, dyspnea, exertional chest pressure/discomfort, irregular heart beat, palpitations and syncope Gastrointestinal: negative for abdominal pain, change in bowel habits, nausea and vomiting Genitourinary: negative for abnormal menstrual periods, genital lesions, sexual problems and vaginal discharge, dysuria and urinary incontinence Integument/breast: negative for breast lump, breast tenderness and nipple discharge Hematologic/lymphatic: negative for bleeding and easy bruising Musculoskeletal:negative for back pain and muscle weakness. Occasional muscle spasms in abdomen.  Neurological: negative for dizziness, headaches, vertigo and weakness Endocrine: negative for diabetic symptoms including polydipsia, polyuria and skin dryness Allergic/Immunologic: negative for hay fever and urticaria        Objective:  Blood pressure (!) 84/55, pulse 76, height 5' 8.5" (1.74 m), weight 138 lb 3.2 oz (62.7 kg), last menstrual period 06/02/2019, not currently breastfeeding. Body mass index is 20.71 kg/m.    General Appearance:    Alert, cooperative, no distress, appears stated age  Head:    Normocephalic, without obvious abnormality, atraumatic  Eyes:    PERRL, conjunctiva/corneas clear, EOM's intact, both eyes  Ears:    Normal external ear canals, both ears  Nose:   Nares normal, septum midline, mucosa normal, no drainage or sinus tenderness  Throat:   Lips, mucosa, and tongue normal; teeth and gums normal  Neck:   Supple, symmetrical, trachea  midline, no adenopathy; thyroid: no enlargement/tenderness/nodules; no carotid bruit or JVD  Back:     Symmetric, no curvature, ROM normal, no CVA tenderness  Lungs:     Clear to auscultation bilaterally, respirations unlabored  Chest Wall:    No tenderness or deformity   Heart:    Regular rate and rhythm, S1 and S2 normal, no murmur, rub or gallop  Breast Exam:    No tenderness, masses, or nipple abnormality  Abdomen:     Soft, non-tender, bowel sounds active all four quadrants, no masses, no organomegaly.    Genitalia:    Pelvic:external genitalia normal, vagina without lesions, discharge, or tenderness, rectovaginal septum  normal. Cervix normal in appearance, no cervical motion tenderness, no adnexal masses or tenderness.  Uterus normal size, shape, mobile, regular contours, nontender.  Rectal:    Normal external sphincter.  No  hemorrhoids appreciated. Internal exam not done.   Extremities:   Extremities normal, atraumatic, no cyanosis or edema  Pulses:   2+ and symmetric all extremities  Skin:   Skin color, texture, turgor normal, no rashes or lesions  Lymph nodes:   Cervical, supraclavicular, and axillary nodes normal  Neurologic:   CNII-XII intact, normal strength, sensation and reflexes throughout   .  Labs:  Lab Results  Component Value Date   WBC 3.6 06/15/2018   HGB 13.6 06/15/2018   HCT 41.2 06/15/2018   MCV 92 06/15/2018   PLT 247 06/15/2018    Lab Results  Component Value Date   CREATININE 0.71 06/15/2018   BUN 8 06/15/2018   NA 139 06/15/2018   K 4.2 06/15/2018   CL 103 06/15/2018   CO2 23 06/15/2018    Lab Results  Component Value Date   ALT 15 06/15/2018   AST 15 06/15/2018   ALKPHOS 55 06/15/2018   BILITOT 0.3 06/15/2018    No results found for: TSH   Assessment:   Healthy female exam.  Family history of cancer   Plan:    Blood tests: CBC with diff and Comprehensive metabolic panel. Breast self exam technique reviewed and patient encouraged to  perform self-exam monthly. Contraception: None. H/o infertility.  Discussed healthy lifestyle modifications. Pap smear up to date. Family history of cancer, should consider genetic cancer screening.  Return to clinic in 1 year.     Hildred Laser, MD [facility]

## 2019-06-18 LAB — COMPREHENSIVE METABOLIC PANEL
ALT: 13 IU/L (ref 0–32)
AST: 13 IU/L (ref 0–40)
Albumin/Globulin Ratio: 2 (ref 1.2–2.2)
Albumin: 4.3 g/dL (ref 3.8–4.8)
Alkaline Phosphatase: 60 IU/L (ref 48–121)
BUN/Creatinine Ratio: 13 (ref 9–23)
BUN: 8 mg/dL (ref 6–20)
Bilirubin Total: 0.5 mg/dL (ref 0.0–1.2)
CO2: 22 mmol/L (ref 20–29)
Calcium: 9.1 mg/dL (ref 8.7–10.2)
Chloride: 104 mmol/L (ref 96–106)
Creatinine, Ser: 0.61 mg/dL (ref 0.57–1.00)
GFR calc Af Amer: 135 mL/min/{1.73_m2} (ref >59)
GFR calc non Af Amer: 117 mL/min/{1.73_m2} (ref >59)
Globulin, Total: 2.2 g/dL (ref 1.5–4.5)
Glucose: 71 mg/dL (ref 65–99)
Potassium: 4.6 mmol/L (ref 3.5–5.2)
Sodium: 140 mmol/L (ref 134–144)
Total Protein: 6.5 g/dL (ref 6.0–8.5)

## 2019-06-18 LAB — CBC
Hematocrit: 40.6 % (ref 34.0–46.6)
Hemoglobin: 13.5 g/dL (ref 11.1–15.9)
MCH: 31.4 pg (ref 26.6–33.0)
MCHC: 33.3 g/dL (ref 31.5–35.7)
MCV: 94 fL (ref 79–97)
Platelets: 248 10*3/uL (ref 150–450)
RBC: 4.3 x10E6/uL (ref 3.77–5.28)
RDW: 12.2 % (ref 11.7–15.4)
WBC: 6.2 10*3/uL (ref 3.4–10.8)

## 2019-06-18 LAB — LIPID PANEL
Chol/HDL Ratio: 2.2 ratio (ref 0.0–4.4)
Cholesterol, Total: 171 mg/dL (ref 100–199)
HDL: 79 mg/dL (ref >39)
LDL Chol Calc (NIH): 81 mg/dL (ref 0–99)
Triglycerides: 57 mg/dL (ref 0–149)
VLDL Cholesterol Cal: 11 mg/dL (ref 5–40)

## 2019-06-18 LAB — TSH: TSH: 1.71 u[IU]/mL (ref 0.450–4.500)

## 2020-03-08 ENCOUNTER — Other Ambulatory Visit (INDEPENDENT_AMBULATORY_CARE_PROVIDER_SITE_OTHER): Payer: Self-pay | Admitting: Nurse Practitioner

## 2020-03-08 DIAGNOSIS — I83813 Varicose veins of bilateral lower extremities with pain: Secondary | ICD-10-CM

## 2020-03-14 ENCOUNTER — Encounter (INDEPENDENT_AMBULATORY_CARE_PROVIDER_SITE_OTHER): Payer: Self-pay

## 2020-03-14 ENCOUNTER — Encounter (INDEPENDENT_AMBULATORY_CARE_PROVIDER_SITE_OTHER): Payer: Self-pay | Admitting: Nurse Practitioner

## 2020-06-20 ENCOUNTER — Other Ambulatory Visit: Payer: Self-pay

## 2020-06-20 ENCOUNTER — Encounter: Payer: Self-pay | Admitting: Obstetrics and Gynecology

## 2020-06-20 ENCOUNTER — Ambulatory Visit (INDEPENDENT_AMBULATORY_CARE_PROVIDER_SITE_OTHER): Payer: BC Managed Care – PPO | Admitting: Obstetrics and Gynecology

## 2020-06-20 VITALS — BP 119/75 | HR 86 | Resp 16 | Ht 69.0 in | Wt 134.4 lb

## 2020-06-20 DIAGNOSIS — Z1159 Encounter for screening for other viral diseases: Secondary | ICD-10-CM | POA: Diagnosis not present

## 2020-06-20 DIAGNOSIS — Z8616 Personal history of COVID-19: Secondary | ICD-10-CM

## 2020-06-20 DIAGNOSIS — Z809 Family history of malignant neoplasm, unspecified: Secondary | ICD-10-CM

## 2020-06-20 DIAGNOSIS — Z01419 Encounter for gynecological examination (general) (routine) without abnormal findings: Secondary | ICD-10-CM | POA: Diagnosis not present

## 2020-06-20 DIAGNOSIS — Z8742 Personal history of other diseases of the female genital tract: Secondary | ICD-10-CM | POA: Insufficient documentation

## 2020-06-20 MED ORDER — AZITHROMYCIN 250 MG PO TABS: ORAL_TABLET | ORAL | 1 refills | Status: DC

## 2020-06-20 NOTE — Progress Notes (Signed)
GYNECOLOGY ANNUAL PHYSICAL EXAM PROGRESS NOTE  Subjective:    Debbie Harding is a 40 y.o. G32P1011 female who presents for an annual exam. The patient has no complaints today. The patient is sexually active.  The patient wears seatbelts: yes. The patient participates in regular exercise: no. Has the patient ever been transfused or tattooed?: no. The patient reports that there is not domestic violence in her life.    Reports she and her husband had COVID in November.  Her symptoms were mild. Her husband was hospitalized for 36 days (had pneumonia, then COVID, then a PE. Required dialysis for a short time due to kidney failure) Started a new job, now Teacher, early years/pre at [employer]. Is very excited about her job.    Gynecologic History Menarche age: 60 Patient's last menstrual period was 05/30/2020 (exact date). Contraception:  none . Patient has a h/o infertility requiring IVF.  History of STI's: Denies Last Pap: 06/15/2018. Results were: normal.  Denies h/o abnormal pap smears.    Upstream - 06/20/20 6314       Pregnancy Intention Screening   Does the patient want to become pregnant in the next year? Ok Either Way    Does the patient's partner want to become pregnant in the next year? Ok Either Way    Would the patient like to discuss contraceptive options today? No      Contraception Wrap Up   Current Method No Method - Other Reason    Contraception Counseling Provided No            The pregnancy intention screening data noted above was reviewed. Potential methods of contraception were not discussed. The patient elected to proceed with No Method - Other Reason.     OB History  Gravida Para Term Preterm AB Living  2 1 1  0 1 1  SAB IAB Ectopic Multiple Live Births  1 0 0 0 1    # Outcome Date GA Lbr Len/2nd Weight Sex Delivery Anes PTL Lv  2 Term 11/09/17 [redacted]w[redacted]d  8 lb 9.9 oz (3.91 kg) F CS-LVertical Spinal  LIV     Name: [facility]     Apgar1: 9  Apgar5: 9  1 SAB 2018             Past Medical History:  Diagnosis Date   Medical history non-contributory     Past Surgical History:  Procedure Laterality Date   CESAREAN SECTION N/A 11/09/2017   Procedure: PRIMARY CESAREAN SECTION;  Surgeon: 13/04/2017, MD;  Location: [facility] ORS;  Service: Obstetrics;  Laterality: N/A;   SKIN TAG REMOVAL  2018   VENOUS THROMBECTOMY Left    WISDOM TOOTH EXTRACTION  1998    Family History  Problem Relation Age of Onset   Healthy Mother    Healthy Father    Breast cancer Maternal Grandmother 27   Ovarian cancer Maternal Grandmother 49   Breast cancer Other 41    Social History   Socioeconomic History   Marital status: Single    Spouse name: Not on file   Number of children: Not on file   Years of education: Not on file   Highest education level: Not on file  Occupational History   Not on file  Tobacco Use   Smoking status: Never   Smokeless tobacco: Never  Vaping Use   Vaping Use: Never used  Substance and Sexual Activity   Alcohol use: Yes    Comment: rare   Drug use:  No   Sexual activity: Yes    Birth control/protection: Coitus interruptus, None  Other Topics Concern   Not on file  Social History Narrative   Not on file   Social Determinants of Health   Financial Resource Strain: Not on file  Food Insecurity: Not on file  Transportation Needs: Not on file  Physical Activity: Not on file  Stress: Not on file  Social Connections: Not on file  Intimate Partner Violence: Not on file    Current Outpatient Medications on File Prior to Visit  Medication Sig Dispense Refill   cetirizine (ZYRTEC) 10 MG chewable tablet Chew 10 mg by mouth as needed for allergies.     No current facility-administered medications on file prior to visit.    Allergies  Allergen Reactions   Latex Rash    Irritates skin severly   Penicillins Shortness Of Breath and Rash    Has patient had a PCN reaction causing immediate rash, facial/tongue/throat swelling, SOB  or lightheadedness with hypotension: Yes Has patient had a PCN reaction causing severe rash involving mucus membranes or skin necrosis: No Has patient had a PCN reaction that required hospitalization: No Has patient had a PCN reaction occurring within the last 10 years: No If all of the above answers are "NO", then may proceed with Cephalosporin use.    Codeine Nausea Only and Rash   Elemental Sulfur Rash     Review of Systems Constitutional: negative for chills, fatigue, fevers and sweats Eyes: negative for irritation, redness and visual disturbance Ears, nose, mouth, throat, and face: negative for hearing loss, nasal congestion, snoring and tinnitus Respiratory: negative for asthma, cough, sputum Cardiovascular: negative for chest pain, dyspnea, exertional chest pressure/discomfort, irregular heart beat, palpitations and syncope Gastrointestinal: negative for abdominal pain, change in bowel habits, nausea and vomiting Genitourinary: negative for abnormal menstrual periods, genital lesions, sexual problems and vaginal discharge, dysuria and urinary incontinence Integument/breast: negative for breast lump, breast tenderness and nipple discharge Hematologic/lymphatic: negative for bleeding and easy bruising Musculoskeletal:negative for back pain and muscle weakness. Occasional muscle spasms in abdomen.  Neurological: negative for dizziness, headaches, vertigo and weakness Endocrine: negative for diabetic symptoms including polydipsia, polyuria and skin dryness Allergic/Immunologic: negative for hay fever and urticaria        Objective:  Blood pressure 119/75, pulse 86, resp. rate 16, height 5\' 9"  (1.753 m), weight 134 lb 6.4 oz (61 kg), last menstrual period 05/30/2020. Body mass index is 19.85 kg/m.    General Appearance:    Alert, cooperative, no distress, appears stated age  Head:    Normocephalic, without obvious abnormality, atraumatic  Eyes:    PERRL, conjunctiva/corneas clear,  EOM's intact, both eyes  Ears:    Normal external ear canals, both ears  Nose:   Nares normal, septum midline, mucosa normal, no drainage or sinus tenderness  Throat:   Lips, mucosa, and tongue normal; teeth and gums normal  Neck:   Supple, symmetrical, trachea midline, no adenopathy; thyroid: no enlargement/tenderness/nodules; no carotid bruit or JVD  Back:     Symmetric, no curvature, ROM normal, no CVA tenderness  Lungs:     Clear to auscultation bilaterally, respirations unlabored  Chest Wall:    No tenderness or deformity   Heart:    Regular rate and rhythm, S1 and S2 normal, no murmur, rub or gallop  Breast Exam:    No tenderness, masses, or nipple abnormality  Abdomen:     Soft, non-tender, bowel sounds active all four quadrants, no  masses, no organomegaly.    Genitalia:    Pelvic:external genitalia normal, vagina without lesions, discharge, or tenderness, rectovaginal septum  normal. Cervix normal in appearance, no cervical motion tenderness, no adnexal masses or tenderness.  Uterus normal size, shape, mobile, regular contours, nontender.  Rectal:    Normal external sphincter.  No hemorrhoids appreciated. Internal exam not done.   Extremities:   Extremities normal, atraumatic, no cyanosis or edema  Pulses:   2+ and symmetric all extremities  Skin:   Skin color, texture, turgor normal, no rashes or lesions  Lymph nodes:   Cervical, supraclavicular, and axillary nodes normal  Neurologic:   CNII-XII intact, normal strength, sensation and reflexes throughout   .  Labs:  Lab Results  Component Value Date   WBC 6.2 06/17/2019   HGB 13.5 06/17/2019   HCT 40.6 06/17/2019   MCV 94 06/17/2019   PLT 248 06/17/2019    Lab Results  Component Value Date   CREATININE 0.61 06/17/2019   BUN 8 06/17/2019   NA 140 06/17/2019   K 4.6 06/17/2019   CL 104 06/17/2019   CO2 22 06/17/2019    Lab Results  Component Value Date   ALT 13 06/17/2019   AST 13 06/17/2019   ALKPHOS 60 06/17/2019    BILITOT 0.5 06/17/2019    Lab Results  Component Value Date   TSH 1.710 06/17/2019     Assessment:   Healthy female exam. Family history of cancer Need for Hepatitis C screen History of COVID infection History of infertility   Plan:    Blood tests: CBC with diff and Comprehensive metabolic panel.  Patient also desires to have COVID antibody testing performed.  Breast self exam technique reviewed and patient encouraged to perform self-exam monthly. Contraception: None. H/o infertility.  Discussed healthy lifestyle modifications. Pap smear up to date. Due in 1 year.  Family history of cancer, given information previously to consider genetic cancer screening. May consider in the future.  Recommend screening for Hepatitis C at least once during lifetime. Ordered today. Return to clinic in 1 year.     Hildred Laser, MD [facility]

## 2020-06-21 LAB — CBC
Hematocrit: 41.4 % (ref 34.0–46.6)
Hemoglobin: 13.7 g/dL (ref 11.1–15.9)
MCH: 31.2 pg (ref 26.6–33.0)
MCHC: 33.1 g/dL (ref 31.5–35.7)
MCV: 94 fL (ref 79–97)
Platelets: 245 10*3/uL (ref 150–450)
RBC: 4.39 x10E6/uL (ref 3.77–5.28)
RDW: 12.6 % (ref 11.7–15.4)
WBC: 5.1 10*3/uL (ref 3.4–10.8)

## 2020-06-21 LAB — COMPREHENSIVE METABOLIC PANEL
ALT: 22 IU/L (ref 0–32)
AST: 20 IU/L (ref 0–40)
Albumin/Globulin Ratio: 2 (ref 1.2–2.2)
Albumin: 4.6 g/dL (ref 3.8–4.8)
Alkaline Phosphatase: 83 IU/L (ref 44–121)
BUN/Creatinine Ratio: 14 (ref 9–23)
BUN: 8 mg/dL (ref 6–20)
Bilirubin Total: 0.5 mg/dL (ref 0.0–1.2)
CO2: 22 mmol/L (ref 20–29)
Calcium: 9.2 mg/dL (ref 8.7–10.2)
Chloride: 104 mmol/L (ref 96–106)
Creatinine, Ser: 0.58 mg/dL (ref 0.57–1.00)
Globulin, Total: 2.3 g/dL (ref 1.5–4.5)
Glucose: 67 mg/dL (ref 65–99)
Potassium: 4.2 mmol/L (ref 3.5–5.2)
Sodium: 144 mmol/L (ref 134–144)
Total Protein: 6.9 g/dL (ref 6.0–8.5)
eGFR: 119 mL/min/{1.73_m2} (ref >59)

## 2020-06-21 LAB — HEPATITIS C ANTIBODY: Hep C Virus Ab: <0.1 s/co ratio (ref 0.0–0.9)

## 2020-09-18 ENCOUNTER — Telehealth: Payer: Self-pay | Admitting: Obstetrics and Gynecology

## 2020-09-18 NOTE — Telephone Encounter (Signed)
Pt called asking for a referral to [facility] for a varicose vein located left calf and inner thigh. I asked if pt had PCP since they normally would send in referrals, pt stated that Dr.Cherry is her primary doctor. Please Advise.

## 2020-09-18 NOTE — Telephone Encounter (Signed)
Please advise. Thanks Jurgen Groeneveld 

## 2020-09-20 ENCOUNTER — Other Ambulatory Visit: Payer: Self-pay

## 2020-09-20 DIAGNOSIS — I83812 Varicose veins of left lower extremities with pain: Secondary | ICD-10-CM

## 2020-09-20 NOTE — Telephone Encounter (Signed)
Pt is aware that her referral was placed. Informed pt that she should receive a call from them in 5-7 days to please contact us. Pt voiced that she understood.

## 2020-09-26 ENCOUNTER — Encounter (INDEPENDENT_AMBULATORY_CARE_PROVIDER_SITE_OTHER): Payer: Self-pay | Admitting: Nurse Practitioner

## 2020-09-26 ENCOUNTER — Other Ambulatory Visit: Payer: Self-pay

## 2020-09-26 ENCOUNTER — Ambulatory Visit (INDEPENDENT_AMBULATORY_CARE_PROVIDER_SITE_OTHER): Payer: BC Managed Care – PPO

## 2020-09-26 ENCOUNTER — Ambulatory Visit (INDEPENDENT_AMBULATORY_CARE_PROVIDER_SITE_OTHER): Payer: BC Managed Care – PPO | Admitting: Nurse Practitioner

## 2020-09-26 VITALS — BP 106/70 | HR 64 | Resp 16 | Wt 136.0 lb

## 2020-09-26 DIAGNOSIS — I83813 Varicose veins of bilateral lower extremities with pain: Secondary | ICD-10-CM | POA: Diagnosis not present

## 2020-09-26 NOTE — Progress Notes (Signed)
Subjective:    Patient ID: Debbie Harding, female    DOB: 09/19/1980, 40 y.o.   MRN: [id number] Chief Complaint  Patient presents with   Follow-up    Ultrasound follow up    Debbie Harding is a 40 year old female that returns to the office for followup status post laser ablation of her left great saphenous vein back on 07/2009. The patient notes initial significant improvement in the lower extremity pain but recently some of the symptoms have returned.. The patient notes multiple residual varicosities in the left lower extremity which continued to hurt with dependent positions and remained tender to palpation. The patient continues to wear graduated compression stockings on a daily basis but these are not eliminating the pain and discomfort. The patient continues to use over-the-counter anti-inflammatory medications to treat the pain and related symptoms but this has not given the patient relief. The patient notes the pain in the lower extremities is causing problems with daily exercise, problems at work and even with household activities such as preparing meals and doing dishes.  The patient is otherwise done well and there have been no complications related to the laser procedure or interval changes in the patient's overall   Today noninvasive studies show evidence of superficial venous reflux in the right great saphenous vein at the proximal thigh extending to the proximal calf.  Left lower extremity shows intact ablation of the great saphenous vein.  However there noted likely accessory saphenous varicosities which do exhibit reflux in the mid thigh and distal thigh.  This area correlates with where the patient has larger bulging varicosities.     Review of Systems  Musculoskeletal:  Positive for myalgias.  All other systems reviewed and are negative.     Objective:   Physical Exam Vitals reviewed.  HENT:     Head: Normocephalic.  Cardiovascular:     Rate and Rhythm: Normal rate.      Pulses: Normal pulses.  Pulmonary:     Effort: Pulmonary effort is normal.  Skin:    General: Skin is warm and dry.     Comments: Notable varicosities in LLE   Neurological:     Mental Status: She is alert and oriented to person, place, and time.  Psychiatric:        Mood and Affect: Mood normal.        Behavior: Behavior normal.        Thought Content: Thought content normal.        Judgment: Judgment normal.    BP 106/70 (BP Location: Right Arm)   Pulse 64   Resp 16   Wt 136 lb (61.7 kg)   BMI 20.08 kg/m   Past Medical History:  Diagnosis Date   Medical history non-contributory     Social History   Socioeconomic History   Marital status: Single    Spouse name: Not on file   Number of children: Not on file   Years of education: Not on file   Highest education level: Not on file  Occupational History   Not on file  Tobacco Use   Smoking status: Never   Smokeless tobacco: Never  Vaping Use   Vaping Use: Never used  Substance and Sexual Activity   Alcohol use: Yes    Comment: rare   Drug use: No   Sexual activity: Yes    Birth control/protection: Coitus interruptus, None  Other Topics Concern   Not on file  Social History Narrative   Not on  file   Social Determinants of Health   Financial Resource Strain: Not on file  Food Insecurity: Not on file  Transportation Needs: Not on file  Physical Activity: Not on file  Stress: Not on file  Social Connections: Not on file  Intimate Partner Violence: Not on file    Past Surgical History:  Procedure Laterality Date   CESAREAN SECTION N/A 11/09/2017   Procedure: PRIMARY CESAREAN SECTION;  Surgeon: Linzie Collin, MD;  Location: [facility] ORS;  Service: Obstetrics;  Laterality: N/A;   SKIN TAG REMOVAL  2018   VENOUS THROMBECTOMY Left    WISDOM TOOTH EXTRACTION  1998    Family History  Problem Relation Age of Onset   Healthy Mother    Healthy Father    Breast cancer Maternal Grandmother 60   Ovarian  cancer Maternal Grandmother 39   Breast cancer Other 35    Allergies  Allergen Reactions   Latex Rash    Irritates skin severly   Penicillins Shortness Of Breath and Rash    Has patient had a PCN reaction causing immediate rash, facial/tongue/throat swelling, SOB or lightheadedness with hypotension: Yes Has patient had a PCN reaction causing severe rash involving mucus membranes or skin necrosis: No Has patient had a PCN reaction that required hospitalization: No Has patient had a PCN reaction occurring within the last 10 years: No If all of the above answers are "NO", then may proceed with Cephalosporin use.    Codeine Nausea Only and Rash   Elemental Sulfur Rash    CBC Latest Ref Rng & Units 06/20/2020 06/17/2019 06/15/2018  WBC 3.4 - 10.8 x10E3/uL 5.1 6.2 3.6  Hemoglobin 11.1 - 15.9 g/dL 03.4 74.2 59.5  Hematocrit 34.0 - 46.6 % 41.4 40.6 41.2  Platelets 150 - 450 x10E3/uL 245 248 247      CMP     Component Value Date/Time   NA 144 06/20/2020 1004   K 4.2 06/20/2020 1004   CL 104 06/20/2020 1004   CO2 22 06/20/2020 1004   GLUCOSE 67 06/20/2020 1004   BUN 8 06/20/2020 1004   CREATININE 0.58 06/20/2020 1004   CALCIUM 9.2 06/20/2020 1004   PROT 6.9 06/20/2020 1004   ALBUMIN 4.6 06/20/2020 1004   AST 20 06/20/2020 1004   ALT 22 06/20/2020 1004   ALKPHOS 83 06/20/2020 1004   BILITOT 0.5 06/20/2020 1004   GFRNONAA 117 06/17/2019 0949   GFRAA 135 06/17/2019 0949     No results found.     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  Patient should undergo injection foam sclerotherapy to treat the residual varicosities of the left lower extremity.  The risks, benefits and alternative therapies were  reviewed in detail with the patient.  All questions were answered.  The patient agrees to proceed with sclerotherapy at their convenience.  The patient will continue wearing the graduated compression stockings and using the over-the-counter pain medications to treat her symptoms.        Current Outpatient Medications on File Prior to Visit  Medication Sig Dispense Refill   cetirizine (ZYRTEC) 10 MG chewable tablet Chew 10 mg by mouth as needed for allergies.     azithromycin (ZITHROMAX) 250 MG tablet Take as directed: Two pills by  mouth the first day, then one pill every day until completed (Patient not taking: Reported on 09/26/2020) 6 tablet 1   No current facility-administered medications on file prior to visit.    There are no Patient Instructions on file for this visit. No follow-ups on file.   Georgiana Spinner, NP

## 2021-06-25 ENCOUNTER — Encounter: Payer: BC Managed Care – PPO | Admitting: Obstetrics and Gynecology

## 2021-07-01 NOTE — Progress Notes (Signed)
GYNECOLOGY ANNUAL PHYSICAL EXAM PROGRESS NOTE  Subjective:    Debbie Harding is a 41 y.o. G1P1011 female who presents for an annual exam. The patient has no complaints today. The patient is sexually active. The patient participates in regular exercise: no. Has the patient ever been transfused or tattooed?: no. The patient reports that there is not domestic violence in her life.   Debbie Harding reports that she has had a difficult time getting in to be seen by a physician for her varicose veins. Has been seen by the NP at the office, but needs procedure scheduled and cannot seem to get in contact with the correct person for scheduling her surgery. This has been ongoing for several months.   Menstrual History: Menarche age: 70 Patient's last menstrual period was 07/03/2021. Period Cycle (Days): 28 Period Duration (Days): 5 Period Pattern: Regular Menstrual Flow: Moderate Menstrual Control: Maxi pad Menstrual Control Change Freq (Hours): 3-4 Dysmenorrhea: None  Gynecologic History:  Contraception: Patient has a h/o infertility requiring IVF.  History of STI's: Denies Last Pap: 06/15/2018. Results were: normal.  Denies h/o abnormal pap smears.   OB History  Gravida Para Term Preterm AB Living  2 1 1  0 1 1  SAB IAB Ectopic Multiple Live Births  1 0 0 0 1    # Outcome Date GA Lbr Len/2nd Weight Sex Delivery Anes PTL Lv  2 Term 11/09/17 [redacted]w[redacted]d  8 lb 9.9 oz (3.91 kg) F CS-LVertical Spinal  LIV     Name: [facility]     Apgar1: 9  Apgar5: 9  1 SAB 2018            Past Medical History:  Diagnosis Date   Varicose vein of leg     Past Surgical History:  Procedure Laterality Date   CESAREAN SECTION N/A 11/09/2017   Procedure: PRIMARY CESAREAN SECTION;  Surgeon: 13/04/2017, MD;  Location: [facility] ORS;  Service: Obstetrics;  Laterality: N/A;   SKIN TAG REMOVAL  2018   VENOUS THROMBECTOMY Left    WISDOM TOOTH EXTRACTION  1998    Family History  Problem Relation Age of Onset    Healthy Mother    Healthy Father    Breast cancer Maternal Grandmother 26   Ovarian cancer Maternal Grandmother 44   Breast cancer Other 14    Social History   Socioeconomic History   Marital status: Single    Spouse name: Not on file   Number of children: Not on file   Years of education: Not on file   Highest education level: Not on file  Occupational History   Not on file  Tobacco Use   Smoking status: Never   Smokeless tobacco: Never  Vaping Use   Vaping Use: Never used  Substance and Sexual Activity   Alcohol use: Yes    Comment: rare   Drug use: No   Sexual activity: Yes    Birth control/protection: Coitus interruptus, None  Other Topics Concern   Not on file  Social History Narrative   Not on file   Social Determinants of Health   Financial Resource Strain: Not on file  Food Insecurity: Not on file  Transportation Needs: Not on file  Physical Activity: Not on file  Stress: Not on file  Social Connections: Not on file  Intimate Partner Violence: Not on file    Current Outpatient Medications on File Prior to Visit  Medication Sig Dispense Refill   azithromycin (ZITHROMAX) 250 MG tablet  Take as directed: Two pills by mouth the first day, then one pill every day until completed (Patient not taking: Reported on 09/26/2020) 6 tablet 1   cetirizine (ZYRTEC) 10 MG chewable tablet Chew 10 mg by mouth as needed for allergies.     No current facility-administered medications on file prior to visit.    Allergies  Allergen Reactions   Latex Rash    Irritates skin severly   Penicillins Shortness Of Breath and Rash    Has patient had a PCN reaction causing immediate rash, facial/tongue/throat swelling, SOB or lightheadedness with hypotension: Yes Has patient had a PCN reaction causing severe rash involving mucus membranes or skin necrosis: No Has patient had a PCN reaction that required hospitalization: No Has patient had a PCN reaction occurring within the last  10 years: No If all of the above answers are "NO", then may proceed with Cephalosporin use.    Codeine Nausea Only and Rash   Elemental Sulfur Rash     Review of Systems Constitutional: negative for chills, fatigue, fevers and sweats Eyes: negative for irritation, redness and visual disturbance Ears, nose, mouth, throat, and face: negative for hearing loss, nasal congestion, snoring and tinnitus Respiratory: negative for asthma, cough, sputum Cardiovascular: negative for chest pain, dyspnea, exertional chest pressure/discomfort, irregular heart beat, palpitations and syncope Gastrointestinal: negative for abdominal pain, change in bowel habits, nausea and vomiting Genitourinary: negative for abnormal menstrual periods, genital lesions, sexual problems and vaginal discharge, dysuria and urinary incontinence Integument/breast: negative for breast lump, breast tenderness and nipple discharge Hematologic/lymphatic: negative for bleeding and easy bruising. Musculoskeletal:negative for back pain and muscle weakness Neurological: negative for dizziness, headaches, vertigo and weakness Endocrine: negative for diabetic symptoms including polydipsia, polyuria and skin dryness Allergic/Immunologic: negative for hay fever and urticaria      Objective:  Blood pressure 96/72, pulse 76, resp. rate 16, height 5\' 9"  (1.753 m), weight 136 lb 11.2 oz (62 kg), last menstrual period 07/03/2021.  Body mass index is 20.19 kg/m.    General Appearance:    Alert, cooperative, no distress, appears stated age  Head:    Normocephalic, without obvious abnormality, atraumatic  Eyes:    PERRL, conjunctiva/corneas clear, EOM's intact, both eyes  Ears:    Normal external ear canals, both ears  Nose:   Nares normal, septum midline, mucosa normal, no drainage or sinus tenderness  Throat:   Lips, mucosa, and tongue normal; teeth and gums normal  Neck:   Supple, symmetrical, trachea midline, no adenopathy; thyroid: no  enlargement/tenderness/nodules; no carotid bruit or JVD  Back:     Symmetric, no curvature, ROM normal, no CVA tenderness  Lungs:     Clear to auscultation bilaterally, respirations unlabored  Chest Wall:    No tenderness or deformity   Heart:    Regular rate and rhythm, S1 and S2 normal, no murmur, rub or gallop  Breast Exam:    No tenderness, masses, or nipple abnormality  Abdomen:     Soft, non-tender, bowel sounds active all four quadrants, no masses, no organomegaly.    Genitalia:    Pelvic:external genitalia normal, vagina without lesions, discharge, or tenderness, rectovaginal septum  normal. Cervix normal in appearance, no cervical motion tenderness, no adnexal masses or tenderness.  Uterus normal size, shape, mobile, regular contours, nontender.  Rectal:    Normal external sphincter.  No hemorrhoids appreciated. Internal exam not done.   Extremities:   Extremities normal, atraumatic, no cyanosis or edema. Large varicosities in left leg extending  up to upper thigh.   Pulses:   2+ and symmetric all extremities  Skin:   Skin color, texture, turgor normal, no rashes or lesions  Lymph nodes:   Cervical, supraclavicular, and axillary nodes normal  Neurologic:   CNII-XII intact, normal strength, sensation and reflexes throughout   .  Labs:  Lab Results  Component Value Date   WBC 5.1 06/20/2020   HGB 13.7 06/20/2020   HCT 41.4 06/20/2020   MCV 94 06/20/2020   PLT 245 06/20/2020    Lab Results  Component Value Date   CREATININE 0.58 06/20/2020   BUN 8 06/20/2020   NA 144 06/20/2020   K 4.2 06/20/2020   CL 104 06/20/2020   CO2 22 06/20/2020    Lab Results  Component Value Date   ALT 22 06/20/2020   AST 20 06/20/2020   ALKPHOS 83 06/20/2020   BILITOT 0.5 06/20/2020    Lab Results  Component Value Date   TSH 1.710 06/17/2019    Lab Results  Component Value Date   CHOL 171 06/17/2019   HDL 79 06/17/2019   LDLCALC 81 06/17/2019   TRIG 57 06/17/2019   CHOLHDL 2.2  06/17/2019     Assessment:   1. Encounter for well woman exam with routine gynecological exam   2. Cervical cancer screening   3. Screening for diabetes mellitus (DM)   4. Varicose veins of leg with pain, left   5. Family history of cancer      Plan:  - Blood tests: CBC with diff, Comprehensive metabolic panel, Total cholesterol, and TSH. - Breast self exam technique reviewed and patient encouraged to perform self-exam monthly. - Contraception: None. H/o infertility.  - Discussed healthy lifestyle modifications. - Mammogram  : not age appropriate . Due for screening at age 46.  - Pap smear ordered.  - Family history of cancer, discussed hereditary cancer screening again this visit, patient now ok to perform. [employer]ae ordered.  - Varicose veins, desiring intervention, will reach out to [facility] to help facilitate scheduling.  - Desires refill on Z-pack for occasional URI as needed. Refill given.  - Follow up in 1 year for annual exam   Hildred Laser, MD [facility]

## 2021-07-01 NOTE — Patient Instructions (Addendum)

## 2021-07-03 ENCOUNTER — Encounter: Payer: Self-pay | Admitting: Obstetrics and Gynecology

## 2021-07-03 ENCOUNTER — Ambulatory Visit (INDEPENDENT_AMBULATORY_CARE_PROVIDER_SITE_OTHER): Payer: BC Managed Care – PPO | Admitting: Obstetrics and Gynecology

## 2021-07-03 ENCOUNTER — Other Ambulatory Visit (HOSPITAL_COMMUNITY)
Admission: RE | Admit: 2021-07-03 | Discharge: 2021-07-03 | Disposition: A | Discharge status: Home / Self Care | Payer: BC Managed Care – PPO | Source: Ambulatory Visit | Attending: Obstetrics and Gynecology | Admitting: Obstetrics and Gynecology

## 2021-07-03 VITALS — BP 96/72 | HR 76 | Resp 16 | Ht 69.0 in | Wt 136.7 lb

## 2021-07-03 DIAGNOSIS — Z124 Encounter for screening for malignant neoplasm of cervix: Secondary | ICD-10-CM | POA: Diagnosis not present

## 2021-07-03 DIAGNOSIS — Z809 Family history of malignant neoplasm, unspecified: Secondary | ICD-10-CM

## 2021-07-03 DIAGNOSIS — I83812 Varicose veins of left lower extremities with pain: Secondary | ICD-10-CM

## 2021-07-03 DIAGNOSIS — Z131 Encounter for screening for diabetes mellitus: Secondary | ICD-10-CM | POA: Diagnosis not present

## 2021-07-03 DIAGNOSIS — Z01419 Encounter for gynecological examination (general) (routine) without abnormal findings: Secondary | ICD-10-CM

## 2021-07-03 MED ORDER — AZITHROMYCIN 250 MG PO TABS: ORAL_TABLET | ORAL | 1 refills | Status: DC

## 2021-07-04 LAB — COMPREHENSIVE METABOLIC PANEL
ALT: 27 IU/L (ref 0–32)
AST: 16 IU/L (ref 0–40)
Albumin/Globulin Ratio: 2 (ref 1.2–2.2)
Albumin: 4.3 g/dL (ref 3.8–4.8)
Alkaline Phosphatase: 56 IU/L (ref 44–121)
BUN/Creatinine Ratio: 17 (ref 9–23)
BUN: 10 mg/dL (ref 6–20)
Bilirubin Total: 0.5 mg/dL (ref 0.0–1.2)
CO2: 23 mmol/L (ref 20–29)
Calcium: 9.2 mg/dL (ref 8.7–10.2)
Chloride: 105 mmol/L (ref 96–106)
Creatinine, Ser: 0.58 mg/dL (ref 0.57–1.00)
Globulin, Total: 2.2 g/dL (ref 1.5–4.5)
Glucose: 56 mg/dL — ABNORMAL LOW (ref 70–99)
Potassium: 3.8 mmol/L (ref 3.5–5.2)
Sodium: 142 mmol/L (ref 134–144)
Total Protein: 6.5 g/dL (ref 6.0–8.5)
eGFR: 119 mL/min/{1.73_m2} (ref >59)

## 2021-07-04 LAB — CBC
Hematocrit: 40.9 % (ref 34.0–46.6)
Hemoglobin: 13.3 g/dL (ref 11.1–15.9)
MCH: 30.9 pg (ref 26.6–33.0)
MCHC: 32.5 g/dL (ref 31.5–35.7)
MCV: 95 fL (ref 79–97)
Platelets: 244 10*3/uL (ref 150–450)
RBC: 4.3 x10E6/uL (ref 3.77–5.28)
RDW: 12.3 % (ref 11.7–15.4)
WBC: 4.5 10*3/uL (ref 3.4–10.8)

## 2021-07-04 LAB — HEMOGLOBIN A1C
Est. average glucose Bld gHb Est-mCnc: 97 mg/dL
Hgb A1c MFr Bld: 5 % (ref 4.8–5.6)

## 2021-07-08 LAB — CYTOLOGY - PAP
Comment: NEGATIVE
Diagnosis: NEGATIVE
High risk HPV: NEGATIVE

## 2021-07-13 ENCOUNTER — Encounter: Payer: Self-pay | Admitting: Obstetrics and Gynecology

## 2021-07-16 ENCOUNTER — Telehealth (INDEPENDENT_AMBULATORY_CARE_PROVIDER_SITE_OTHER): Payer: Self-pay | Admitting: Nurse Practitioner

## 2021-07-16 NOTE — Telephone Encounter (Signed)
Patient had referring office reach out to [place]. Patient is upset about not being contacted about her PA and getting scheduled for her sclero. Patient is also requesting a nurse call her back because she has lots of question. I advise she may need to come in for another visit for her concerns and questions. Also let referring office know I would pass this information along to person that takes care of PA for sclero's   LOV - 09/26/20   Please call patient and advise

## 2021-07-17 NOTE — Telephone Encounter (Signed)
Called the patient to apologize for the delay and answer any question I could for her. She states she has an ablation about 10 years ago and wanted to know if this procedure was the same. Advised her the foam sclero was not as bad as the ablation. 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 Advised her she could drive herself but that since she stands for long periods of time she probably needed to take the next day off. And advised her she would be given instructions after the procedure and would leave with legs in wraps that will need to stay in place but she could move around as necessary following the procedure. Advised her if she has any further questions between now and the date to give the office a call if we can not answer them we can bring her in to see the provider.

## 2021-07-17 NOTE — Telephone Encounter (Signed)
Spoke with pt and apologized for the oversight of booking her FOAM sclero appt. I have her scheduled. It was hard to work with her schedule and that resulted in scheduling out until the date. I am mailing her information today and explained that the nurse will be calling her to explain certain details and answer her questions as requested. I advised if she has not heard from [place] by Friday to reach back out.

## 2021-08-28 ENCOUNTER — Encounter: Payer: Self-pay | Admitting: Obstetrics and Gynecology

## 2021-08-28 MED ORDER — MECLIZINE HCL 25 MG PO TABS: 25.0000 mg | ORAL_TABLET | Freq: Three times a day (TID) | ORAL | 0 refills | Status: DC | PRN

## 2021-08-28 MED ORDER — NAPROXEN 500 MG PO TBEC: 500.0000 mg | DELAYED_RELEASE_TABLET | Freq: Two times a day (BID) | ORAL | 1 refills | Status: DC

## 2021-10-03 ENCOUNTER — Ambulatory Visit (INDEPENDENT_AMBULATORY_CARE_PROVIDER_SITE_OTHER): Payer: BC Managed Care – PPO | Admitting: Vascular Surgery

## 2021-10-17 ENCOUNTER — Ambulatory Visit (INDEPENDENT_AMBULATORY_CARE_PROVIDER_SITE_OTHER): Payer: BC Managed Care – PPO | Admitting: Vascular Surgery

## 2021-10-17 ENCOUNTER — Telehealth (INDEPENDENT_AMBULATORY_CARE_PROVIDER_SITE_OTHER): Payer: Self-pay | Admitting: Vascular Surgery

## 2021-10-17 NOTE — Telephone Encounter (Signed)
LVM for pt Debbie Harding and R/S FOAM sclero appt. As Dr. Delana Meyer is in emergent surgery this afternoon. Anderson Malta spoke with pt yesterday and she stated that she did not want to move up her appt to earlier in the day. When pt calls back, please R/S appt.  left leg FOAM sclero. see gs. no auth req.

## 2021-11-04 ENCOUNTER — Encounter (INDEPENDENT_AMBULATORY_CARE_PROVIDER_SITE_OTHER): Payer: Self-pay

## 2021-11-07 ENCOUNTER — Ambulatory Visit (INDEPENDENT_AMBULATORY_CARE_PROVIDER_SITE_OTHER): Payer: BC Managed Care – PPO | Admitting: Vascular Surgery

## 2021-11-07 ENCOUNTER — Encounter (INDEPENDENT_AMBULATORY_CARE_PROVIDER_SITE_OTHER): Payer: Self-pay

## 2021-11-07 ENCOUNTER — Encounter (INDEPENDENT_AMBULATORY_CARE_PROVIDER_SITE_OTHER): Payer: Self-pay | Admitting: Vascular Surgery

## 2021-11-07 DIAGNOSIS — I83819 Varicose veins of unspecified lower extremities with pain: Secondary | ICD-10-CM

## 2021-11-11 ENCOUNTER — Encounter (INDEPENDENT_AMBULATORY_CARE_PROVIDER_SITE_OTHER): Payer: Self-pay | Admitting: Vascular Surgery

## 2021-11-11 DIAGNOSIS — I83819 Varicose veins of unspecified lower extremities with pain: Secondary | ICD-10-CM | POA: Insufficient documentation

## 2021-11-11 NOTE — Progress Notes (Signed)
MRN : [id number]  Debbie Harding is a 41 y.o. (12/09/1980) female who presents with chief complaint of varicose veins hurt.  History of Present Illness:   The patient returns for followup evaluation more than 3 months after the initial visit. The patient continues to have pain in the lower extremities with dependency. The pain is lessened with elevation. Graduated compression stockings, Class I (20-30 mmHg), have been worn but the stockings do not eliminate the leg pain. Over-the-counter analgesics do not improve the symptoms. The degree of discomfort continues to interfere with daily activities. The patient notes the pain in the legs is causing problems with daily exercise, at the workplace and even with household activities and maintenance such as standing in the kitchen preparing meals and doing dishes.   Venous ultrasound performed by myself today shows normal deep venous system, no evidence of acute or chronic DVT.  Superficial reflux is present in the left great saphenous vein and the left anterior lateral saphenous vein.  No outpatient medications have been marked as taking for the 11/07/21 encounter (Appointment) with Gilda Crease, Latina Craver, MD.    Past Medical History:  Diagnosis Date   Varicose vein of leg     Past Surgical History:  Procedure Laterality Date   CESAREAN SECTION N/A 11/09/2017   Procedure: PRIMARY CESAREAN SECTION;  Surgeon: Linzie Collin, MD;  Location: [facility] ORS;  Service: Obstetrics;  Laterality: N/A;   SKIN TAG REMOVAL  2018   VENOUS THROMBECTOMY Left    WISDOM TOOTH EXTRACTION  1998    Social History Social History   Tobacco Use   Smoking status: Never   Smokeless tobacco: Never  Vaping Use   Vaping Use: Never used  Substance Use Topics   Alcohol use: Yes    Comment: rare   Drug use: No    Family History Family History  Problem Relation Age of Onset   Healthy Mother    Healthy Father    Breast cancer Maternal Grandmother 7   Ovarian  cancer Maternal Grandmother 42   Breast cancer Other 35    Allergies  Allergen Reactions   Latex Rash    Irritates skin severly   Penicillins Shortness Of Breath and Rash    Has patient had a PCN reaction causing immediate rash, facial/tongue/throat swelling, SOB or lightheadedness with hypotension: Yes Has patient had a PCN reaction causing severe rash involving mucus membranes or skin necrosis: No Has patient had a PCN reaction that required hospitalization: No Has patient had a PCN reaction occurring within the last 10 years: No If all of the above answers are "NO", then may proceed with Cephalosporin use.    Codeine Nausea Only and Rash   Elemental Sulfur Rash     REVIEW OF SYSTEMS (Negative unless checked)  Constitutional: [] Weight loss  [] Fever  [] Chills Cardiac: [] Chest pain   [] Chest pressure   [] Palpitations   [] Shortness of breath when laying flat   [] Shortness of breath with exertion. Vascular:  [] Pain in legs with walking   [x] Pain in legs with standing  [] History of DVT   [] Phlebitis   [] Swelling in legs   [x] Varicose veins   [] Non-healing ulcers Pulmonary:   [] Uses home oxygen   [] Productive cough   [] Hemoptysis   [] Wheeze  [] COPD   [] Asthma Neurologic:  [] Dizziness   [] Seizures   [] History of stroke   [] History of TIA  [] Aphasia   [] Vissual changes   [] Weakness or numbness in arm   []   Weakness or numbness in leg Musculoskeletal:   [] Joint swelling   [] Joint pain   [] Low back pain Hematologic:  [] Easy bruising  [] Easy bleeding   [] Hypercoagulable state   [] Anemic Gastrointestinal:  [] Diarrhea   [] Vomiting  [] Gastroesophageal reflux/heartburn   [] Difficulty swallowing. Genitourinary:  [] Chronic kidney disease   [] Difficult urination  [] Frequent urination   [] Blood in urine Skin:  [] Rashes   [] Ulcers  Psychological:  [] History of anxiety   []  History of major depression.  Physical Examination  There were no vitals filed for this visit. There is no height or weight on  file to calculate BMI. Gen: WD/WN, NAD Head: [place]/AT, No temporalis wasting.  Ear/Nose/Throat: Hearing grossly intact, nares w/o erythema or drainage, pinna without lesions Eyes: PER, EOMI, sclera nonicteric.  Neck: Supple, no gross masses.  No JVD.  Pulmonary:  Good air movement, no audible wheezing, no use of accessory muscles.  Cardiac: RRR, precordium not hyperdynamic. Vascular:  Large varicosities present, greater than 10 mm left leg.  Veins are tender to palpation  Mild venous stasis changes to the legs bilaterally.  Trace soft pitting edema CEAP C3sEpAsPr Vessel Right Left  Radial Palpable Palpable  Gastrointestinal: soft, non-distended. No guarding/no peritoneal signs.  Musculoskeletal: M/S 5/5 throughout.  No deformity.  Neurologic: CN 2-12 intact. Pain and light touch intact in extremities.  Symmetrical.  Speech is fluent. Motor exam as listed above. Psychiatric: Judgment intact, Mood & affect appropriate for pt's clinical situation. Dermatologic: Venous rashes no ulcers noted.  No changes consistent with cellulitis. Lymph : No lichenification or skin changes of chronic lymphedema.  CBC Lab Results  Component Value Date   WBC 4.5 07/03/2021   HGB 13.3 07/03/2021   HCT 40.9 07/03/2021   MCV 95 07/03/2021   PLT 244 07/03/2021    BMET    Component Value Date/Time   NA 142 07/03/2021 0838   K 3.8 07/03/2021 0838   CL 105 07/03/2021 0838   CO2 23 07/03/2021 0838   GLUCOSE 56 (L) 07/03/2021 0838   BUN 10 07/03/2021 0838   CREATININE 0.58 07/03/2021 0838   CALCIUM 9.2 07/03/2021 0838   GFRNONAA 117 06/17/2019 0949   GFRAA 135 06/17/2019 0949   CrCl cannot be calculated (Patient's most recent lab result is older than the maximum 21 days allowed.).  COAG No results found for: "INR", "PROTIME"  Radiology No results found.   Assessment/Plan 1. Varicose veins with pain Recommend  I have reviewed my previous  discussion with the patient regarding  varicose veins and  why they cause symptoms. Patient will continue  wearing graduated compression stockings class 1 on a daily basis, beginning first thing in the morning and removing them in the evening.  The patient is CEAP C3sEpAsPr.   In addition, behavioral modification including elevation during the day was again discussed and this will continue.  The patient has utilized over the counter pain medications and has been exercising.  However, at this time conservative therapy has not alleviated the patient's symptoms of leg pain and swelling  Recommend: laser ablation of the left great saphenous veins to eliminate the symptoms of pain and swelling of the lower extremities caused by the severe superficial venous reflux disease.     Hortencia Pilar, MD  11/11/2021 11:30 AM

## 2021-12-09 ENCOUNTER — Encounter: Payer: Self-pay | Admitting: Obstetrics and Gynecology

## 2021-12-09 DIAGNOSIS — Z809 Family history of malignant neoplasm, unspecified: Secondary | ICD-10-CM

## 2021-12-09 DIAGNOSIS — N644 Mastodynia: Secondary | ICD-10-CM

## 2021-12-16 ENCOUNTER — Other Ambulatory Visit: Payer: Self-pay | Admitting: Obstetrics and Gynecology

## 2021-12-16 DIAGNOSIS — Z809 Family history of malignant neoplasm, unspecified: Secondary | ICD-10-CM

## 2021-12-16 DIAGNOSIS — N644 Mastodynia: Secondary | ICD-10-CM

## 2022-01-02 ENCOUNTER — Ambulatory Visit
Admission: RE | Admit: 2022-01-02 | Discharge: 2022-01-02 | Disposition: A | Discharge status: Home / Self Care | Payer: BC Managed Care – PPO | Source: Ambulatory Visit | Attending: Obstetrics and Gynecology | Admitting: Obstetrics and Gynecology

## 2022-01-02 DIAGNOSIS — Z809 Family history of malignant neoplasm, unspecified: Secondary | ICD-10-CM

## 2022-01-02 DIAGNOSIS — N644 Mastodynia: Secondary | ICD-10-CM

## 2022-05-11 ENCOUNTER — Encounter (INDEPENDENT_AMBULATORY_CARE_PROVIDER_SITE_OTHER): Payer: Self-pay | Admitting: Vascular Surgery

## 2022-05-13 ENCOUNTER — Other Ambulatory Visit (INDEPENDENT_AMBULATORY_CARE_PROVIDER_SITE_OTHER): Payer: Self-pay | Admitting: Nurse Practitioner

## 2022-05-13 MED ORDER — IBUPROFEN 800 MG PO TABS: 800.0000 mg | ORAL_TABLET | Freq: Three times a day (TID) | ORAL | 0 refills | Status: DC | PRN

## 2022-05-13 NOTE — Telephone Encounter (Signed)
Rx sent 

## 2022-06-09 ENCOUNTER — Telehealth (INDEPENDENT_AMBULATORY_CARE_PROVIDER_SITE_OTHER): Payer: Self-pay | Admitting: Vascular Surgery

## 2022-06-09 NOTE — Telephone Encounter (Signed)
Pt has laser ablation appt scheduled for 6.6.24 with Dr. Gilda Crease. Can we send the Xanax RX to [facility] pharmacy for pt. Please advise.

## 2022-06-09 NOTE — Telephone Encounter (Signed)
Prescription was left on pharmacy voicemail and patient has been notified

## 2022-06-11 NOTE — Progress Notes (Unsigned)
    MRN : [id number]  Debbie Harding is a 42 y.o. (08-30-1980) female who presents with chief complaint of No chief complaint on file. .    The patient's left lower extremity was sterilely prepped and draped.  The ultrasound machine was used to visualize the left great saphenous vein throughout its course.  A segment above the knee was selected for access.  The saphenous vein was accessed without difficulty using ultrasound guidance with a micropuncture needle.   An 0.018  wire was placed beyond the saphenofemoral junction through the sheath and the microneedle was removed.  The 65 cm sheath was then placed over the wire and the wire and dilator were removed.  The laser fiber was placed through the sheath and its tip was placed approximately 2 cm below the saphenofemoral junction.  Tumescent anesthesia was then created with a dilute lidocaine solution.  Laser energy was then delivered with constant withdrawal of the sheath and laser fiber.  Approximately 1295 Joules of energy were delivered over a length of 20 cm.  Sterile dressings were placed.  The patient tolerated the procedure well without complications.

## 2022-06-12 ENCOUNTER — Telehealth (INDEPENDENT_AMBULATORY_CARE_PROVIDER_SITE_OTHER): Payer: Self-pay

## 2022-06-12 ENCOUNTER — Ambulatory Visit (INDEPENDENT_AMBULATORY_CARE_PROVIDER_SITE_OTHER): Payer: BC Managed Care – PPO | Admitting: Vascular Surgery

## 2022-06-12 ENCOUNTER — Encounter (INDEPENDENT_AMBULATORY_CARE_PROVIDER_SITE_OTHER): Payer: Self-pay | Admitting: Vascular Surgery

## 2022-06-12 ENCOUNTER — Other Ambulatory Visit (INDEPENDENT_AMBULATORY_CARE_PROVIDER_SITE_OTHER): Payer: Self-pay | Admitting: Vascular Surgery

## 2022-06-12 VITALS — BP 110/74 | HR 88 | Resp 18

## 2022-06-12 DIAGNOSIS — I83813 Varicose veins of bilateral lower extremities with pain: Secondary | ICD-10-CM

## 2022-06-12 DIAGNOSIS — I83812 Varicose veins of left lower extremities with pain: Secondary | ICD-10-CM | POA: Diagnosis not present

## 2022-06-12 DIAGNOSIS — I83819 Varicose veins of unspecified lower extremities with pain: Secondary | ICD-10-CM

## 2022-06-12 MED ORDER — IBUPROFEN 800 MG PO TABS: 800.0000 mg | ORAL_TABLET | Freq: Three times a day (TID) | ORAL | 0 refills | Status: AC

## 2022-06-12 NOTE — Telephone Encounter (Signed)
Patient was seen today for left laser ablation and has developed some itching with red spots on the upper thigh. I spoke with Dr Gilda Crease and recommended for Medrol dose pack and benadryl 50mg  1 tablet every 6 hours as needed to be called into pharmacy. Patient notified that medications has been sent.

## 2022-06-19 ENCOUNTER — Encounter (INDEPENDENT_AMBULATORY_CARE_PROVIDER_SITE_OTHER): Payer: BC Managed Care – PPO

## 2022-06-19 ENCOUNTER — Other Ambulatory Visit (INDEPENDENT_AMBULATORY_CARE_PROVIDER_SITE_OTHER): Payer: BC Managed Care – PPO | Admitting: Vascular Surgery

## 2022-06-19 ENCOUNTER — Ambulatory Visit (INDEPENDENT_AMBULATORY_CARE_PROVIDER_SITE_OTHER): Payer: BC Managed Care – PPO

## 2022-06-19 DIAGNOSIS — I83813 Varicose veins of bilateral lower extremities with pain: Secondary | ICD-10-CM

## 2022-07-19 NOTE — Progress Notes (Signed)
MRN : [id number]  Debbie Harding is a 42 y.o. (20-Apr-1980) female who presents with chief complaint of legs hurt and swell.  History of Present Illness:   The patient returns to the office for followup status post laser ablation of the left great saphenous vein on 06/12/2022.  The patient note significant improvement in the lower extremity pain but not resolution of the symptoms. The patient notes multiple residual varicosities bilaterally which continued to hurt with dependent positions and remained tender to palpation. The patient's swelling is minimally from preoperative status. The patient continues to wear graduated compression stockings on a daily basis but these are not eliminating the pain and discomfort. The patient continues to use over-the-counter anti-inflammatory medications to treat the pain and related symptoms but this has not given the patient relief. The patient notes the pain in the lower extremities is causing problems with daily exercise, problems at work and even with household activities such as preparing meals and doing dishes.  The patient is otherwise done well and there have been no complications related to the laser procedure or interval changes in the patient's overall   Post laser ultrasound dated 06/20/2022 shows successful ablation of the left great saphenous vein   No outpatient medications have been marked as taking for the 07/21/22 encounter (Appointment) with Gilda Crease, Latina Craver, MD.    Past Medical History:  Diagnosis Date   Varicose vein of leg     Past Surgical History:  Procedure Laterality Date   CESAREAN SECTION N/A 11/09/2017   Procedure: PRIMARY CESAREAN SECTION;  Surgeon: Linzie Collin, MD;  Location: [facility] ORS;  Service: Obstetrics;  Laterality: N/A;   SKIN TAG REMOVAL  2018   VENOUS THROMBECTOMY Left    WISDOM TOOTH EXTRACTION  1998    Social History Social History   Tobacco Use   Smoking status: Never   Smokeless tobacco: Never   Vaping Use   Vaping status: Never Used  Substance Use Topics   Alcohol use: Yes    Comment: rare   Drug use: No    Family History Family History  Problem Relation Age of Onset   Healthy Mother    Healthy Father    Breast cancer Maternal Grandmother 94   Ovarian cancer Maternal Grandmother 42   Breast cancer Other 35    Allergies  Allergen Reactions   Latex Rash    Irritates skin severly   Penicillins Shortness Of Breath and Rash    Has patient had a PCN reaction causing immediate rash, facial/tongue/throat swelling, SOB or lightheadedness with hypotension: Yes Has patient had a PCN reaction causing severe rash involving mucus membranes or skin necrosis: No Has patient had a PCN reaction that required hospitalization: No Has patient had a PCN reaction occurring within the last 10 years: No If all of the above answers are "NO", then may proceed with Cephalosporin use.    Codeine Nausea Only and Rash   Elemental Sulfur Rash     REVIEW OF SYSTEMS (Negative unless checked)  Constitutional: [] Weight loss  [] Fever  [] Chills Cardiac: [] Chest pain   [] Chest pressure   [] Palpitations   [] Shortness of breath when laying flat   [] Shortness of breath with exertion. Vascular:  [] Pain in legs with walking   [x] Pain in legs at rest  [] History of DVT   [] Phlebitis   [x] Swelling in legs   [] Varicose veins   [] Non-healing ulcers Pulmonary:   [] Uses home oxygen   [] Productive  cough   [] Hemoptysis   [] Wheeze  [] COPD   [] Asthma Neurologic:  [] Dizziness   [] Seizures   [] History of stroke   [] History of TIA  [] Aphasia   [] Vissual changes   [] Weakness or numbness in arm   [] Weakness or numbness in leg Musculoskeletal:   [] Joint swelling   [] Joint pain   [] Low back pain Hematologic:  [] Easy bruising  [] Easy bleeding   [] Hypercoagulable state   [] Anemic Gastrointestinal:  [] Diarrhea   [] Vomiting  [] Gastroesophageal reflux/heartburn   [] Difficulty swallowing. Genitourinary:  [] Chronic kidney  disease   [] Difficult urination  [] Frequent urination   [] Blood in urine Skin:  [] Rashes   [] Ulcers  Psychological:  [] History of anxiety   []  History of major depression.  Physical Examination  There were no vitals filed for this visit. There is no height or weight on file to calculate BMI. Gen: WD/WN, NAD Head: [place]/AT, No temporalis wasting.  Ear/Nose/Throat: Hearing grossly intact, nares w/o erythema or drainage, pinna without lesions Eyes: PER, EOMI, sclera nonicteric.  Neck: Supple, no gross masses.  No JVD.  Pulmonary:  Good air movement, no audible wheezing, no use of accessory muscles.  Cardiac: RRR, precordium not hyperdynamic. Vascular:  Varicosities present bilaterally with veins measuring greater than 10 mm left lower extremity.  Mild venous stasis changes to the legs bilaterally.  Trace soft pitting edema. CEAP C3sEpAsPr  Vessel Right Left  Radial Palpable Palpable  Gastrointestinal: soft, non-distended. No guarding/no peritoneal signs.  Musculoskeletal: M/S 5/5 throughout.  No deformity.  Neurologic: CN 2-12 intact. Pain and light touch intact in extremities.  Symmetrical.  Speech is fluent. Motor exam as listed above. Psychiatric: Judgment intact, Mood & affect appropriate for pt's clinical situation. Dermatologic: Venous rashes no ulcers noted.  No changes consistent with cellulitis. Lymph : No lichenification or skin changes of chronic lymphedema.  CBC Lab Results  Component Value Date   WBC 4.5 07/03/2021   HGB 13.3 07/03/2021   HCT 40.9 07/03/2021   MCV 95 07/03/2021   PLT 244 07/03/2021    BMET    Component Value Date/Time   NA 142 07/03/2021 0838   K 3.8 07/03/2021 0838   CL 105 07/03/2021 0838   CO2 23 07/03/2021 0838   GLUCOSE 56 (L) 07/03/2021 0838   BUN 10 07/03/2021 0838   CREATININE 0.58 07/03/2021 0838   CALCIUM 9.2 07/03/2021 0838   GFRNONAA 117 06/17/2019 0949   GFRAA 135 06/17/2019 0949   CrCl cannot be calculated (Patient's most recent  lab result is older than the maximum 21 days allowed.).  COAG No results found for: "INR", "PROTIME"  Radiology No results found.   Assessment/Plan 1. Varicose veins with pain Recommend:  The patient has had successful ablation of the previously incompetent left saphenous venous system but still has persistent symptoms of pain and swelling that are having a negative impact on daily life and daily activities.  She is CEAP C3sEpAsPr  Patient should undergo injection sclerotherapy to treat the residual varicosities of the left lower extremity.  The risks, benefits and alternative therapies were reviewed in detail with the patient.  All questions were answered.  The patient agrees to proceed with sclerotherapy at their convenience.  The patient will continue wearing the graduated compression stockings and using the over-the-counter pain medications to treat her symptoms.        Levora Dredge, MD  07/19/2022 3:16 PM

## 2022-07-21 ENCOUNTER — Ambulatory Visit (INDEPENDENT_AMBULATORY_CARE_PROVIDER_SITE_OTHER): Payer: BC Managed Care – PPO | Admitting: Vascular Surgery

## 2022-07-21 VITALS — BP 100/68 | HR 85 | Resp 17 | Ht 69.0 in | Wt 134.4 lb

## 2022-07-21 DIAGNOSIS — I83819 Varicose veins of unspecified lower extremities with pain: Secondary | ICD-10-CM

## 2022-07-24 ENCOUNTER — Telehealth (INDEPENDENT_AMBULATORY_CARE_PROVIDER_SITE_OTHER): Payer: Self-pay | Admitting: Vascular Surgery

## 2022-07-24 ENCOUNTER — Encounter (INDEPENDENT_AMBULATORY_CARE_PROVIDER_SITE_OTHER): Payer: Self-pay | Admitting: Vascular Surgery

## 2022-07-24 MED ORDER — NAPROXEN 500 MG PO TABS: 500.0000 mg | ORAL_TABLET | Freq: Two times a day (BID) | ORAL | 2 refills | Status: DC | PRN

## 2022-07-24 NOTE — Telephone Encounter (Signed)
Made in error

## 2022-10-09 ENCOUNTER — Telehealth (INDEPENDENT_AMBULATORY_CARE_PROVIDER_SITE_OTHER): Payer: Self-pay

## 2022-10-09 ENCOUNTER — Encounter (INDEPENDENT_AMBULATORY_CARE_PROVIDER_SITE_OTHER): Payer: Self-pay

## 2022-10-09 NOTE — Telephone Encounter (Signed)
I spoke with pt and answered questions about appts and reached out to her insurance to get an extension on PA dates

## 2022-10-09 NOTE — Telephone Encounter (Signed)
Patient called stating she spoke with you a few weeks and she would like to speak with you again, she has some questions.

## 2022-10-13 ENCOUNTER — Ambulatory Visit (INDEPENDENT_AMBULATORY_CARE_PROVIDER_SITE_OTHER): Payer: BC Managed Care – PPO | Admitting: Vascular Surgery

## 2022-11-03 ENCOUNTER — Ambulatory Visit (INDEPENDENT_AMBULATORY_CARE_PROVIDER_SITE_OTHER): Payer: BC Managed Care – PPO | Admitting: Vascular Surgery

## 2022-11-06 ENCOUNTER — Ambulatory Visit (INDEPENDENT_AMBULATORY_CARE_PROVIDER_SITE_OTHER): Payer: BC Managed Care – PPO | Admitting: Vascular Surgery

## 2022-11-10 ENCOUNTER — Ambulatory Visit (INDEPENDENT_AMBULATORY_CARE_PROVIDER_SITE_OTHER): Payer: BC Managed Care – PPO | Admitting: Vascular Surgery

## 2022-11-13 ENCOUNTER — Ambulatory Visit (INDEPENDENT_AMBULATORY_CARE_PROVIDER_SITE_OTHER): Payer: BC Managed Care – PPO | Admitting: Vascular Surgery

## 2022-11-20 ENCOUNTER — Encounter (INDEPENDENT_AMBULATORY_CARE_PROVIDER_SITE_OTHER): Payer: Self-pay | Admitting: Vascular Surgery

## 2022-11-20 ENCOUNTER — Ambulatory Visit (INDEPENDENT_AMBULATORY_CARE_PROVIDER_SITE_OTHER): Payer: BC Managed Care – PPO | Admitting: Vascular Surgery

## 2022-11-20 VITALS — BP 103/67 | HR 88 | Resp 16 | Wt 135.2 lb

## 2022-11-20 DIAGNOSIS — I83819 Varicose veins of unspecified lower extremities with pain: Secondary | ICD-10-CM

## 2022-11-20 DIAGNOSIS — I83812 Varicose veins of left lower extremities with pain: Secondary | ICD-10-CM

## 2022-11-20 MED ORDER — NAPROXEN 500 MG PO TABS: 500.0000 mg | ORAL_TABLET | Freq: Two times a day (BID) | ORAL | 3 refills | Status: AC | PRN

## 2022-11-20 NOTE — Progress Notes (Signed)
   Indication:  Patient presents with symptomatic varicose veins of the left lower extremity.  Procedure:  Sclerotherapy using both SDS and hypertonic saline mixed with 1% Lidocaine was performed on the left lower extremity.  Compression wraps were placed.  The patient tolerated the procedure well.  Plan:  Follow up as needed.

## 2022-11-24 ENCOUNTER — Ambulatory Visit (INDEPENDENT_AMBULATORY_CARE_PROVIDER_SITE_OTHER): Payer: BC Managed Care – PPO | Admitting: Vascular Surgery

## 2022-12-05 ENCOUNTER — Other Ambulatory Visit: Payer: Self-pay | Admitting: Medical Genetics

## 2022-12-08 ENCOUNTER — Ambulatory Visit (INDEPENDENT_AMBULATORY_CARE_PROVIDER_SITE_OTHER): Payer: BC Managed Care – PPO | Admitting: Vascular Surgery

## 2022-12-08 ENCOUNTER — Other Ambulatory Visit
Admission: RE | Admit: 2022-12-08 | Discharge: 2022-12-08 | Disposition: A | Discharge status: Home / Self Care | Payer: Self-pay | Source: Ambulatory Visit | Attending: Medical Genetics | Admitting: Medical Genetics

## 2022-12-10 ENCOUNTER — Other Ambulatory Visit: Payer: Self-pay | Admitting: Medical Genetics

## 2022-12-17 NOTE — Progress Notes (Unsigned)
   Indication:  Patient presents with symptomatic varicose veins of the left lower extremity.  Procedure:  Sclerotherapy using hypertonic saline mixed with 1% Lidocaine was performed on the left lower extremity.  Compression wraps were placed.  The patient tolerated the procedure well.  Plan:  Follow up as needed.   

## 2022-12-18 ENCOUNTER — Ambulatory Visit (INDEPENDENT_AMBULATORY_CARE_PROVIDER_SITE_OTHER): Payer: BC Managed Care – PPO | Admitting: Vascular Surgery

## 2022-12-18 ENCOUNTER — Encounter (INDEPENDENT_AMBULATORY_CARE_PROVIDER_SITE_OTHER): Payer: Self-pay | Admitting: Vascular Surgery

## 2022-12-18 VITALS — BP 103/67 | HR 82 | Resp 16 | Wt 135.2 lb

## 2022-12-18 DIAGNOSIS — I83812 Varicose veins of left lower extremities with pain: Secondary | ICD-10-CM | POA: Diagnosis not present

## 2022-12-18 DIAGNOSIS — I83819 Varicose veins of unspecified lower extremities with pain: Secondary | ICD-10-CM

## 2022-12-23 LAB — GENECONNECT MOLECULAR SCREEN: Genetic Analysis Overall Interpretation: NEGATIVE

## 2023-01-05 ENCOUNTER — Ambulatory Visit (INDEPENDENT_AMBULATORY_CARE_PROVIDER_SITE_OTHER): Payer: BC Managed Care – PPO | Admitting: Vascular Surgery

## 2023-01-05 VITALS — BP 104/42 | HR 65 | Resp 16 | Wt 135.8 lb

## 2023-01-05 DIAGNOSIS — I83813 Varicose veins of bilateral lower extremities with pain: Secondary | ICD-10-CM

## 2023-01-05 DIAGNOSIS — I83819 Varicose veins of unspecified lower extremities with pain: Secondary | ICD-10-CM | POA: Diagnosis not present

## 2023-01-08 ENCOUNTER — Encounter (INDEPENDENT_AMBULATORY_CARE_PROVIDER_SITE_OTHER): Payer: Self-pay | Admitting: Vascular Surgery

## 2023-02-16 ENCOUNTER — Other Ambulatory Visit: Payer: Self-pay | Admitting: Obstetrics and Gynecology

## 2023-02-16 DIAGNOSIS — Z1231 Encounter for screening mammogram for malignant neoplasm of breast: Secondary | ICD-10-CM

## 2023-02-20 ENCOUNTER — Ambulatory Visit
Admission: RE | Admit: 2023-02-20 | Discharge: 2023-02-20 | Disposition: A | Discharge status: Home / Self Care | Payer: BC Managed Care – PPO | Source: Ambulatory Visit | Attending: Obstetrics and Gynecology | Admitting: Obstetrics and Gynecology

## 2023-02-20 DIAGNOSIS — Z1231 Encounter for screening mammogram for malignant neoplasm of breast: Secondary | ICD-10-CM | POA: Diagnosis present

## 2023-02-25 ENCOUNTER — Encounter: Payer: Self-pay | Admitting: Obstetrics and Gynecology

## 2023-02-25 NOTE — Progress Notes (Unsigned)
GYNECOLOGY ANNUAL PHYSICAL EXAM PROGRESS NOTE  Subjective:    Debbie Harding is a 43 y.o. G32P1011 female who presents for an annual exam.  The patient {is/is not/has never been:13135} sexually active. The patient participates in regular exercise: {yes/no/not asked:9010}. Has the patient ever been transfused or tattooed?: {yes/no/not asked:9010}. The patient reports that there {is/is not:9024} domestic violence in her life.   The patient has the following complaints today:   Menstrual History: Menarche age: 73 Patient's last menstrual period was 02/02/2023.     Gynecologic History:  Contraception: Patient has a h/o infertility requiring IVF.  History of STI's: Denies Last Pap: 07/03/2021. Results were: normal. Notes h/o abnormal pap smears. Last mammogram: 02/20/2023. Results were: normal       OB History  Gravida Para Term Preterm AB Living  2 1 1  0 1 1  SAB IAB Ectopic Multiple Live Births  1 0 0 0 1    # Outcome Date GA Lbr Len/2nd Weight Sex Type Anes PTL Lv  2 Term 11/09/17 [redacted]w[redacted]d  8 lb 9.9 oz (3.91 kg) F CS-LVertical Spinal  LIV     Name: [facility]     Apgar1: 9  Apgar5: 9  1 SAB 2018            Past Medical History:  Diagnosis Date   Varicose vein of leg     Past Surgical History:  Procedure Laterality Date   CESAREAN SECTION N/A 11/09/2017   Procedure: PRIMARY CESAREAN SECTION;  Surgeon: Linzie Collin, MD;  Location: [facility] ORS;  Service: Obstetrics;  Laterality: N/A;   SKIN TAG REMOVAL  2018   VENOUS THROMBECTOMY Left    WISDOM TOOTH EXTRACTION  1998    Family History  Problem Relation Age of Onset   Healthy Mother    Healthy Father    Breast cancer Maternal Grandmother 39   Ovarian cancer Maternal Grandmother 32   Breast cancer Other 64    Social History   Socioeconomic History   Marital status: Married    Spouse name: Not on file   Number of children: Not on file   Years of education: Not on file   Highest education level:  Not on file  Occupational History   Not on file  Tobacco Use   Smoking status: Never   Smokeless tobacco: Never  Vaping Use   Vaping status: Never Used  Substance and Sexual Activity   Alcohol use: Yes    Comment: rare   Drug use: No   Sexual activity: Yes    Birth control/protection: Coitus interruptus, None  Other Topics Concern   Not on file  Social History Narrative   Not on file   Social Drivers of Health   Financial Resource Strain: Not on file  Food Insecurity: Not on file  Transportation Needs: Not on file  Physical Activity: Not on file  Stress: Not on file  Social Connections: Not on file  Intimate Partner Violence: Not on file    Current Outpatient Medications on File Prior to Visit  Medication Sig Dispense Refill   cetirizine (ZYRTEC) 10 MG chewable tablet Chew 10 mg by mouth as needed for allergies.     ibuprofen (ADVIL) 800 MG tablet Take 1 tablet (800 mg total) by mouth every 8 (eight) hours as needed. 30 tablet 0   naproxen (NAPROSYN) 500 MG tablet Take 1 tablet (500 mg total) by mouth 2 (two) times daily as needed for moderate pain (pain score 4-6).  With meals if possible 60 tablet 3   No current facility-administered medications on file prior to visit.    Allergies  Allergen Reactions   Latex Rash    Irritates skin severly   Penicillins Shortness Of Breath and Rash    Has patient had a PCN reaction causing immediate rash, facial/tongue/throat swelling, SOB or lightheadedness with hypotension: Yes Has patient had a PCN reaction causing severe rash involving mucus membranes or skin necrosis: No Has patient had a PCN reaction that required hospitalization: No Has patient had a PCN reaction occurring within the last 10 years: No If all of the above answers are "NO", then may proceed with Cephalosporin use.    Codeine Nausea Only and Rash   Elemental Sulfur Rash     Review of Systems Constitutional: negative for chills, fatigue, fevers and  sweats Eyes: negative for irritation, redness and visual disturbance Ears, nose, mouth, throat, and face: negative for hearing loss, nasal congestion, snoring and tinnitus Respiratory: negative for asthma, cough, sputum Cardiovascular: negative for chest pain, dyspnea, exertional chest pressure/discomfort, irregular heart beat, palpitations and syncope Gastrointestinal: negative for abdominal pain, change in bowel habits, nausea and vomiting Genitourinary: negative for abnormal menstrual periods, genital lesions, sexual problems and vaginal discharge, dysuria and urinary incontinence Integument/breast: negative for breast lump, breast tenderness and nipple discharge Hematologic/lymphatic: negative for bleeding and easy bruising Musculoskeletal:negative for back pain and muscle weakness Neurological: negative for dizziness, headaches, vertigo and weakness Endocrine: negative for diabetic symptoms including polydipsia, polyuria and skin dryness Allergic/Immunologic: negative for hay fever and urticaria      Objective:  Last menstrual period 02/02/2023. There is no height or weight on file to calculate BMI.    General Appearance:    Alert, cooperative, no distress, appears stated age  Head:    Normocephalic, without obvious abnormality, atraumatic  Eyes:    PERRL, conjunctiva/corneas clear, EOM's intact, both eyes  Ears:    Normal external ear canals, both ears  Nose:   Nares normal, septum midline, mucosa normal, no drainage or sinus tenderness  Throat:   Lips, mucosa, and tongue normal; teeth and gums normal  Neck:   Supple, symmetrical, trachea midline, no adenopathy; thyroid: no enlargement/tenderness/nodules; no carotid bruit or JVD  Back:     Symmetric, no curvature, ROM normal, no CVA tenderness  Lungs:     Clear to auscultation bilaterally, respirations unlabored  Chest Wall:    No tenderness or deformity   Heart:    Regular rate and rhythm, S1 and S2 normal, no murmur, rub or  gallop  Breast Exam:    No tenderness, masses, or nipple abnormality  Abdomen:     Soft, non-tender, bowel sounds active all four quadrants, no masses, no organomegaly.    Genitalia:    Pelvic:external genitalia normal, vagina without lesions, discharge, or tenderness, rectovaginal septum  normal. Cervix normal in appearance, no cervical motion tenderness, no adnexal masses or tenderness.  Uterus normal size, shape, mobile, regular contours, nontender.  Rectal:    Normal external sphincter.  No hemorrhoids appreciated. Internal exam not done.   Extremities:   Extremities normal, atraumatic, no cyanosis or edema  Pulses:   2+ and symmetric all extremities  Skin:   Skin color, texture, turgor normal, no rashes or lesions  Lymph nodes:   Cervical, supraclavicular, and axillary nodes normal  Neurologic:   CNII-XII intact, normal strength, sensation and reflexes throughout   .  Labs:  Lab Results  Component Value Date   WBC  4.5 07/03/2021   HGB 13.3 07/03/2021   HCT 40.9 07/03/2021   MCV 95 07/03/2021   PLT 244 07/03/2021    Lab Results  Component Value Date   CREATININE 0.58 07/03/2021   BUN 10 07/03/2021   NA 142 07/03/2021   K 3.8 07/03/2021   CL 105 07/03/2021   CO2 23 07/03/2021    Lab Results  Component Value Date   ALT 27 07/03/2021   AST 16 07/03/2021   ALKPHOS 56 07/03/2021   BILITOT 0.5 07/03/2021    Lab Results  Component Value Date   TSH 1.710 06/17/2019     Assessment:   No diagnosis found.   Plan:  Blood tests: {blood tests:13147}. Breast self exam technique reviewed and patient encouraged to perform self-exam monthly. Contraception: none. Discussed healthy lifestyle modifications. Mammogram ordered for next visit Pap smear  UTD . Flu vaccine: Follow up in 1 year for annual exam   Hildred Laser, MD [facility]

## 2023-02-25 NOTE — Patient Instructions (Signed)
 Preventive Care 39-41 Years Old, Female Preventive care refers to lifestyle choices and visits with your health care provider that can promote health and wellness. Preventive care visits are also called wellness exams. What can I expect for my preventive care visit? Counseling Your health care provider may ask you questions about your: Medical history, including: Past medical problems. Family medical history. Pregnancy history. Current health, including: Menstrual cycle. Method of birth control. Emotional well-being. Home life and relationship well-being. Sexual activity and sexual health. Lifestyle, including: Alcohol, nicotine or tobacco, and drug use. Access to firearms. Diet, exercise, and sleep habits. Work and work Astronomer. Sunscreen use. Safety issues such as seatbelt and bike helmet use. Physical exam Your health care provider will check your: Height and weight. These may be used to calculate your BMI (body mass index). BMI is a measurement that tells if you are at a healthy weight. Waist circumference. This measures the distance around your waistline. This measurement also tells if you are at a healthy weight and may help predict your risk of certain diseases, such as type 2 diabetes and high blood pressure. Heart rate and blood pressure. Body temperature. Skin for abnormal spots. What immunizations do I need?  Vaccines are usually given at various ages, according to a schedule. Your health care provider will recommend vaccines for you based on your age, medical history, and lifestyle or other factors, such as travel or where you work. What tests do I need? Screening Your health care provider may recommend screening tests for certain conditions. This may include: Lipid and cholesterol levels. Diabetes screening. This is done by checking your blood sugar (glucose) after you have not eaten for a while (fasting). Pelvic exam and Pap test. Hepatitis B test. Hepatitis C  test. HIV (human immunodeficiency virus) test. STI (sexually transmitted infection) testing, if you are at risk. Lung cancer screening. Colorectal cancer screening. Mammogram. Talk with your health care provider about when you should start having regular mammograms. This may depend on whether you have a family history of breast cancer. BRCA-related cancer screening. This may be done if you have a family history of breast, ovarian, tubal, or peritoneal cancers. Bone density scan. This is done to screen for osteoporosis. Talk with your health care provider about your test results, treatment options, and if necessary, the need for more tests. Follow these instructions at home: Eating and drinking  Eat a diet that includes fresh fruits and vegetables, whole grains, lean protein, and low-fat dairy products. Take vitamin and mineral supplements as recommended by your health care provider. Do not drink alcohol if: Your health care provider tells you not to drink. You are pregnant, may be pregnant, or are planning to become pregnant. If you drink alcohol: Limit how much you have to 0-1 drink a day. Know how much alcohol is in your drink. In the [place] one drink equals one 12 oz bottle of beer (355 mL), one 5 oz glass of wine (148 mL), or one 1 oz glass of hard liquor (44 mL). Lifestyle Brush your teeth every morning and night with fluoride toothpaste. Floss one time each day. Exercise for at least 30 minutes 5 or more days each week. Do not use any products that contain nicotine or tobacco. These products include cigarettes, chewing tobacco, and vaping devices, such as e-cigarettes. If you need help quitting, ask your health care provider. Do not use drugs. If you are sexually active, practice safe sex. Use a condom or other form of protection to  prevent STIs. If you do not wish to become pregnant, use a form of birth control. If you plan to become pregnant, see your health care provider for a  prepregnancy visit. Take aspirin only as told by your health care provider. Make sure that you understand how much to take and what form to take. Work with your health care provider to find out whether it is safe and beneficial for you to take aspirin daily. Find healthy ways to manage stress, such as: Meditation, yoga, or listening to music. Journaling. Talking to a trusted person. Spending time with friends and family. Minimize exposure to UV radiation to reduce your risk of skin cancer. Safety Always wear your seat belt while driving or riding in a vehicle. Do not drive: If you have been drinking alcohol. Do not ride with someone who has been drinking. When you are tired or distracted. While texting. If you have been using any mind-altering substances or drugs. Wear a helmet and other protective equipment during sports activities. If you have firearms in your house, make sure you follow all gun safety procedures. Seek help if you have been physically or sexually abused. What's next? Visit your health care provider once a year for an annual wellness visit. Ask your health care provider how often you should have your eyes and teeth checked. Stay up to date on all vaccines. This information is not intended to replace advice given to you by your health care provider. Make sure you discuss any questions you have with your health care provider. Document Revised: 06/20/2020 Document Reviewed: 06/20/2020 Elsevier Patient Education  2024 [employer] Breast Self-Awareness Breast self-awareness is knowing how your breasts look and feel. You need to: Check your breasts on a regular basis. Tell your doctor about any changes. Become familiar with the look and feel of your breasts. This can help you catch a breast problem while it is still small and can be treated. You should do breast self-exams even if you have breast implants. What you need: A mirror. A well-lit room. A pillow or other  soft object. How to do a breast self-exam Follow these steps to do a breast self-exam: Look for changes  Take off all the clothes above your waist. Stand in front of a mirror in a room with good lighting. Put your hands down at your sides. Compare your breasts in the mirror. Look for any difference between them, such as: A difference in shape. A difference in size. Wrinkles, dips, and bumps in one breast and not the other. Look at each breast for changes in the skin, such as: Redness. Scaly areas. Skin that has gotten thicker. Dimpling. Open sores (ulcers). Look for changes in your nipples, such as: Fluid coming out of a nipple. Fluid around a nipple. Bleeding. Dimpling. Redness. A nipple that looks pushed in (retracted), or that has changed position. Feel for changes Lie on your back. Feel each breast. To do this: Pick a breast to feel. Place a pillow under the shoulder closest to that breast. Put the arm closest to that breast behind your head. Feel the nipple area of that breast using the hand of your other arm. Feel the area with the pads of your three middle fingers by making small circles with your fingers. Use light, medium, and firm pressure. Continue the overlapping circles, moving downward over the breast. Keep making circles with your fingers. Stop when you feel your ribs. Start making circles with your fingers again, this time going  upward until you reach your collarbone. Then, make circles outward across your breast and into your armpit area. Squeeze your nipple. Check for discharge and lumps. Repeat these steps to check your other breast. Sit or stand in the tub or shower. With soapy water on your skin, feel each breast the same way you did when you were lying down. Write down what you find Writing down what you find can help you remember what to tell your doctor. Write down: What is normal for each breast. Any changes you find in each breast. These  include: The kind of changes you find. A tender or painful breast. Any lump you find. Write down its size and where it is. When you last had your monthly period (menstrual cycle). General tips If you are breastfeeding, the best time to check your breasts is after you feed your baby or after you use a breast pump. If you get monthly bleeding, the best time to check your breasts is 5-7 days after your monthly cycle ends. With time, you will become comfortable with the self-exam. You will also start to know if there are changes in your breasts. Contact a doctor if: You see a change in the shape or size of your breasts or nipples. You see a change in the skin of your breast or nipples, such as red or scaly skin. You have fluid coming from your nipples that is not normal. You find a new lump or thick area. You have breast pain. You have any concerns about your breast health. Summary Breast self-awareness includes looking for changes in your breasts and feeling for changes within your breasts. You should do breast self-awareness in front of a mirror in a well-lit room. If you get monthly periods (menstrual cycles), the best time to check your breasts is 5-7 days after your period ends. Tell your doctor about any changes you see in your breasts. Changes include changes in size, changes on the skin, painful or tender breasts, or fluid from your nipples that is not normal. This information is not intended to replace advice given to you by your health care provider. Make sure you discuss any questions you have with your health care provider. Document Revised: 05/30/2021 Document Reviewed: 10/25/2020 Elsevier Patient Education  2024 [employer].

## 2023-02-26 ENCOUNTER — Ambulatory Visit (INDEPENDENT_AMBULATORY_CARE_PROVIDER_SITE_OTHER): Payer: BC Managed Care – PPO | Admitting: Obstetrics and Gynecology

## 2023-02-26 ENCOUNTER — Encounter: Payer: Self-pay | Admitting: Obstetrics and Gynecology

## 2023-02-26 VITALS — BP 92/43 | HR 80 | Ht 69.0 in | Wt 136.6 lb

## 2023-02-26 DIAGNOSIS — Z13220 Encounter for screening for lipoid disorders: Secondary | ICD-10-CM

## 2023-02-26 DIAGNOSIS — Z809 Family history of malignant neoplasm, unspecified: Secondary | ICD-10-CM

## 2023-02-26 DIAGNOSIS — I8392 Asymptomatic varicose veins of left lower extremity: Secondary | ICD-10-CM

## 2023-02-26 DIAGNOSIS — Z01419 Encounter for gynecological examination (general) (routine) without abnormal findings: Secondary | ICD-10-CM

## 2023-02-26 DIAGNOSIS — Z1231 Encounter for screening mammogram for malignant neoplasm of breast: Secondary | ICD-10-CM

## 2023-02-26 DIAGNOSIS — Z131 Encounter for screening for diabetes mellitus: Secondary | ICD-10-CM

## 2023-02-26 MED ORDER — AZITHROMYCIN 250 MG PO TABS: ORAL_TABLET | ORAL | 1 refills | Status: AC

## 2023-02-27 LAB — CBC
Hematocrit: 38.6 % (ref 34.0–46.6)
Hemoglobin: 12.8 g/dL (ref 11.1–15.9)
MCH: 31.1 pg (ref 26.6–33.0)
MCHC: 33.2 g/dL (ref 31.5–35.7)
MCV: 94 fL (ref 79–97)
Platelets: 250 10*3/uL (ref 150–450)
RBC: 4.11 x10E6/uL (ref 3.77–5.28)
RDW: 12.4 % (ref 11.7–15.4)
WBC: 7.4 10*3/uL (ref 3.4–10.8)

## 2023-02-27 LAB — COMPREHENSIVE METABOLIC PANEL
ALT: 15 [IU]/L (ref 0–32)
AST: 14 [IU]/L (ref 0–40)
Albumin: 4.3 g/dL (ref 3.9–4.9)
Alkaline Phosphatase: 58 [IU]/L (ref 44–121)
BUN/Creatinine Ratio: 18 (ref 9–23)
BUN: 12 mg/dL (ref 6–24)
Bilirubin Total: 0.4 mg/dL (ref 0.0–1.2)
CO2: 21 mmol/L (ref 20–29)
Calcium: 9.1 mg/dL (ref 8.7–10.2)
Chloride: 104 mmol/L (ref 96–106)
Creatinine, Ser: 0.66 mg/dL (ref 0.57–1.00)
Globulin, Total: 2 g/dL (ref 1.5–4.5)
Glucose: 77 mg/dL (ref 70–99)
Potassium: 3.9 mmol/L (ref 3.5–5.2)
Sodium: 139 mmol/L (ref 134–144)
Total Protein: 6.3 g/dL (ref 6.0–8.5)
eGFR: 114 mL/min/{1.73_m2} (ref >59)

## 2023-02-27 LAB — LIPID PANEL
Chol/HDL Ratio: 2.7 {ratio} (ref 0.0–4.4)
Cholesterol, Total: 187 mg/dL (ref 100–199)
HDL: 70 mg/dL (ref >39)
LDL Chol Calc (NIH): 94 mg/dL (ref 0–99)
Triglycerides: 131 mg/dL (ref 0–149)
VLDL Cholesterol Cal: 23 mg/dL (ref 5–40)

## 2023-02-27 LAB — TSH: TSH: 1.43 u[IU]/mL (ref 0.450–4.500)

## 2023-03-03 ENCOUNTER — Encounter: Payer: Self-pay | Admitting: Obstetrics and Gynecology

## 2023-05-26 ENCOUNTER — Encounter (INDEPENDENT_AMBULATORY_CARE_PROVIDER_SITE_OTHER): Payer: Self-pay
